# Patient Record
Sex: Male | Born: 1945 | Race: White | Hispanic: No | Marital: Married | Smoking: Current every day smoker
Health system: Southern US, Community
[De-identification: ages and names within clinical notes are randomized; demographics above are authoritative.]

## PROBLEM LIST (undated history)

## (undated) DIAGNOSIS — N529 Male erectile dysfunction, unspecified: Secondary | ICD-10-CM

## (undated) DIAGNOSIS — G4730 Sleep apnea, unspecified: Secondary | ICD-10-CM

## (undated) DIAGNOSIS — N400 Enlarged prostate without lower urinary tract symptoms: Secondary | ICD-10-CM

## (undated) DIAGNOSIS — E785 Hyperlipidemia, unspecified: Secondary | ICD-10-CM

## (undated) DIAGNOSIS — H269 Unspecified cataract: Secondary | ICD-10-CM

## (undated) DIAGNOSIS — R972 Elevated prostate specific antigen [PSA]: Secondary | ICD-10-CM

## (undated) DIAGNOSIS — J449 Chronic obstructive pulmonary disease, unspecified: Secondary | ICD-10-CM

## (undated) DIAGNOSIS — I2510 Atherosclerotic heart disease of native coronary artery without angina pectoris: Secondary | ICD-10-CM

## (undated) HISTORY — PX: APPENDECTOMY: SHX54

## (undated) HISTORY — DX: Male erectile dysfunction, unspecified: N52.9

## (undated) HISTORY — PX: EYE SURGERY: SHX253

## (undated) HISTORY — DX: Unspecified cataract: H26.9

## (undated) HISTORY — DX: Benign prostatic hyperplasia without lower urinary tract symptoms: N40.0

## (undated) HISTORY — DX: Atherosclerotic heart disease of native coronary artery without angina pectoris: I25.10

## (undated) HISTORY — DX: Chronic obstructive pulmonary disease, unspecified: J44.9

## (undated) HISTORY — DX: Hyperlipidemia, unspecified: E78.5

## (undated) HISTORY — DX: Sleep apnea, unspecified: G47.30

## (undated) HISTORY — DX: Elevated prostate specific antigen (PSA): R97.20

---
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
## 1971-02-22 HISTORY — PX: VASECTOMY: SHX75

## 1996-01-16 ENCOUNTER — Encounter (INDEPENDENT_AMBULATORY_CARE_PROVIDER_SITE_OTHER): Payer: Self-pay | Admitting: Internal Medicine

## 1996-01-16 LAB — CONVERTED CEMR LAB: PSA: 0.9 ng/mL

## 1999-12-10 ENCOUNTER — Encounter (INDEPENDENT_AMBULATORY_CARE_PROVIDER_SITE_OTHER): Payer: Self-pay | Admitting: Internal Medicine

## 1999-12-10 LAB — CONVERTED CEMR LAB: PSA: 0.9 ng/mL

## 2002-05-02 ENCOUNTER — Encounter (INDEPENDENT_AMBULATORY_CARE_PROVIDER_SITE_OTHER): Payer: Self-pay | Admitting: Internal Medicine

## 2002-05-02 LAB — CONVERTED CEMR LAB: PSA: 0.9 ng/mL

## 2005-06-21 ENCOUNTER — Encounter (INDEPENDENT_AMBULATORY_CARE_PROVIDER_SITE_OTHER): Payer: Self-pay | Admitting: Internal Medicine

## 2005-06-21 LAB — CONVERTED CEMR LAB: PSA: 1.23 ng/mL

## 2005-06-28 ENCOUNTER — Ambulatory Visit: Payer: Self-pay | Admitting: Family Medicine

## 2005-07-01 ENCOUNTER — Ambulatory Visit: Payer: Self-pay | Admitting: Family Medicine

## 2005-07-22 ENCOUNTER — Ambulatory Visit: Payer: Self-pay | Admitting: Family Medicine

## 2005-07-27 ENCOUNTER — Ambulatory Visit: Payer: Self-pay | Admitting: Internal Medicine

## 2005-08-05 ENCOUNTER — Ambulatory Visit: Payer: Self-pay | Admitting: Family Medicine

## 2005-08-17 ENCOUNTER — Ambulatory Visit (HOSPITAL_BASED_OUTPATIENT_CLINIC_OR_DEPARTMENT_OTHER): Admission: RE | Admit: 2005-08-17 | Discharge: 2005-08-17 | Payer: Self-pay | Admitting: Family Medicine

## 2005-08-17 ENCOUNTER — Encounter (INDEPENDENT_AMBULATORY_CARE_PROVIDER_SITE_OTHER): Payer: Self-pay | Admitting: Internal Medicine

## 2005-09-02 ENCOUNTER — Ambulatory Visit: Payer: Self-pay | Admitting: Family Medicine

## 2005-09-16 ENCOUNTER — Ambulatory Visit: Payer: Self-pay | Admitting: Pulmonary Disease

## 2005-10-07 ENCOUNTER — Ambulatory Visit: Payer: Self-pay | Admitting: Family Medicine

## 2005-11-25 ENCOUNTER — Ambulatory Visit: Payer: Self-pay | Admitting: Family Medicine

## 2005-12-27 ENCOUNTER — Ambulatory Visit: Payer: Self-pay | Admitting: Family Medicine

## 2006-04-28 ENCOUNTER — Encounter: Payer: Self-pay | Admitting: Internal Medicine

## 2006-04-28 DIAGNOSIS — F528 Other sexual dysfunction not due to a substance or known physiological condition: Secondary | ICD-10-CM

## 2006-04-28 DIAGNOSIS — E785 Hyperlipidemia, unspecified: Secondary | ICD-10-CM

## 2006-04-28 DIAGNOSIS — F17200 Nicotine dependence, unspecified, uncomplicated: Secondary | ICD-10-CM

## 2006-04-28 DIAGNOSIS — G4733 Obstructive sleep apnea (adult) (pediatric): Secondary | ICD-10-CM

## 2006-06-09 ENCOUNTER — Ambulatory Visit: Payer: Self-pay | Admitting: Family Medicine

## 2007-05-11 ENCOUNTER — Ambulatory Visit: Payer: Self-pay | Admitting: Family Medicine

## 2007-05-11 LAB — CONVERTED CEMR LAB
ALT: 23 units/L (ref 0–53)
AST: 23 units/L (ref 0–37)
Albumin: 3.9 g/dL (ref 3.5–5.2)
Alkaline Phosphatase: 84 units/L (ref 39–117)
BUN: 18 mg/dL (ref 6–23)
Bilirubin, Direct: 0.1 mg/dL (ref 0.0–0.3)
CO2: 30 meq/L (ref 19–32)
Calcium: 9.4 mg/dL (ref 8.4–10.5)
Chloride: 103 meq/L (ref 96–112)
Cholesterol: 197 mg/dL (ref 0–200)
Creatinine, Ser: 1.1 mg/dL (ref 0.4–1.5)
GFR calc Af Amer: 87 mL/min
GFR calc non Af Amer: 72 mL/min
Glucose, Bld: 95 mg/dL (ref 70–99)
HDL: 34.1 mg/dL — ABNORMAL LOW (ref >39.0)
LDL Cholesterol: 131 mg/dL — ABNORMAL HIGH (ref 0–99)
PSA: 2.14 ng/mL (ref 0.10–4.00)
Potassium: 4.3 meq/L (ref 3.5–5.1)
Sodium: 137 meq/L (ref 135–145)
TSH: 3.85 microintl units/mL (ref 0.35–5.50)
Total Bilirubin: 0.9 mg/dL (ref 0.3–1.2)
Total CHOL/HDL Ratio: 5.8
Total Protein: 6.7 g/dL (ref 6.0–8.3)
Triglycerides: 162 mg/dL — ABNORMAL HIGH (ref 0–149)
VLDL: 32 mg/dL (ref 0–40)

## 2007-05-21 ENCOUNTER — Ambulatory Visit: Payer: Self-pay | Admitting: Family Medicine

## 2007-07-02 ENCOUNTER — Ambulatory Visit: Payer: Self-pay | Admitting: Family Medicine

## 2007-07-02 LAB — CONVERTED CEMR LAB
OCCULT 1: NEGATIVE
OCCULT 2: NEGATIVE
OCCULT 3: NEGATIVE

## 2007-07-04 ENCOUNTER — Encounter (INDEPENDENT_AMBULATORY_CARE_PROVIDER_SITE_OTHER): Payer: Self-pay | Admitting: *Deleted

## 2008-04-28 ENCOUNTER — Ambulatory Visit: Payer: Self-pay | Admitting: Family Medicine

## 2008-04-28 LAB — CONVERTED CEMR LAB
ALT: 17 units/L (ref 0–53)
AST: 20 units/L (ref 0–37)
Albumin: 3.8 g/dL (ref 3.5–5.2)
Alkaline Phosphatase: 81 units/L (ref 39–117)
BUN: 20 mg/dL (ref 6–23)
Basophils Absolute: 0 10*3/uL (ref 0.0–0.1)
Basophils Relative: 0.1 % (ref 0.0–3.0)
Bilirubin, Direct: 0.1 mg/dL (ref 0.0–0.3)
CO2: 30 meq/L (ref 19–32)
Calcium: 9.4 mg/dL (ref 8.4–10.5)
Chloride: 105 meq/L (ref 96–112)
Cholesterol: 192 mg/dL (ref 0–200)
Creatinine, Ser: 1.1 mg/dL (ref 0.4–1.5)
Eosinophils Absolute: 0.3 10*3/uL (ref 0.0–0.7)
Eosinophils Relative: 3.2 % (ref 0.0–5.0)
GFR calc Af Amer: 86 mL/min
GFR calc non Af Amer: 71 mL/min
Glucose, Bld: 106 mg/dL — ABNORMAL HIGH (ref 70–99)
HCT: 45 % (ref 39.0–52.0)
HDL: 37.1 mg/dL — ABNORMAL LOW (ref >39.0)
Hemoglobin: 16.1 g/dL (ref 13.0–17.0)
LDL Cholesterol: 141 mg/dL — ABNORMAL HIGH (ref 0–99)
Lymphocytes Relative: 17.5 % (ref 12.0–46.0)
MCHC: 35.7 g/dL (ref 30.0–36.0)
MCV: 92.5 fL (ref 78.0–100.0)
Monocytes Absolute: 0.9 10*3/uL (ref 0.1–1.0)
Monocytes Relative: 9.4 % (ref 3.0–12.0)
Neutro Abs: 7 10*3/uL (ref 1.4–7.7)
Neutrophils Relative %: 69.8 % (ref 43.0–77.0)
PSA: 2.36 ng/mL (ref 0.10–4.00)
Platelets: 183 10*3/uL (ref 150–400)
Potassium: 5 meq/L (ref 3.5–5.1)
RBC: 4.86 M/uL (ref 4.22–5.81)
RDW: 12.2 % (ref 11.5–14.6)
Sodium: 140 meq/L (ref 135–145)
TSH: 2.45 microintl units/mL (ref 0.35–5.50)
Total Bilirubin: 1 mg/dL (ref 0.3–1.2)
Total CHOL/HDL Ratio: 5.2
Total Protein: 6.7 g/dL (ref 6.0–8.3)
Triglycerides: 72 mg/dL (ref 0–149)
VLDL: 14 mg/dL (ref 0–40)
WBC: 9.9 10*3/uL (ref 4.5–10.5)

## 2008-05-08 ENCOUNTER — Ambulatory Visit: Payer: Self-pay | Admitting: Family Medicine

## 2008-05-12 ENCOUNTER — Encounter: Payer: Self-pay | Admitting: Family Medicine

## 2009-09-24 ENCOUNTER — Encounter (INDEPENDENT_AMBULATORY_CARE_PROVIDER_SITE_OTHER): Payer: Self-pay | Admitting: *Deleted

## 2009-12-04 ENCOUNTER — Telehealth (INDEPENDENT_AMBULATORY_CARE_PROVIDER_SITE_OTHER): Payer: Self-pay | Admitting: *Deleted

## 2009-12-07 ENCOUNTER — Ambulatory Visit: Payer: Self-pay | Admitting: Family Medicine

## 2009-12-07 LAB — CONVERTED CEMR LAB
ALT: 19 units/L (ref 0–53)
AST: 20 units/L (ref 0–37)
Albumin: 4.2 g/dL (ref 3.5–5.2)
Alkaline Phosphatase: 78 units/L (ref 39–117)
BUN: 14 mg/dL (ref 6–23)
Bilirubin, Direct: 0.1 mg/dL (ref 0.0–0.3)
CO2: 29 meq/L (ref 19–32)
Calcium: 9.7 mg/dL (ref 8.4–10.5)
Chloride: 102 meq/L (ref 96–112)
Cholesterol: 212 mg/dL — ABNORMAL HIGH (ref 0–200)
Creatinine, Ser: 1 mg/dL (ref 0.4–1.5)
Direct LDL: 137.1 mg/dL
GFR calc non Af Amer: 80.19 mL/min (ref >60)
Glucose, Bld: 93 mg/dL (ref 70–99)
HDL: 40.9 mg/dL (ref >39.00)
PSA: 4.77 ng/mL — ABNORMAL HIGH (ref 0.10–4.00)
Potassium: 4.8 meq/L (ref 3.5–5.1)
Sodium: 138 meq/L (ref 135–145)
TSH: 2.13 microintl units/mL (ref 0.35–5.50)
Total Bilirubin: 1.1 mg/dL (ref 0.3–1.2)
Total CHOL/HDL Ratio: 5
Total Protein: 7.1 g/dL (ref 6.0–8.3)
Triglycerides: 165 mg/dL — ABNORMAL HIGH (ref 0.0–149.0)
VLDL: 33 mg/dL (ref 0.0–40.0)

## 2009-12-10 ENCOUNTER — Ambulatory Visit: Payer: Self-pay | Admitting: Family Medicine

## 2009-12-10 ENCOUNTER — Encounter: Payer: Self-pay | Admitting: Gastroenterology

## 2009-12-10 DIAGNOSIS — R972 Elevated prostate specific antigen [PSA]: Secondary | ICD-10-CM

## 2010-01-21 HISTORY — PX: COLONOSCOPY: SHX174

## 2010-01-22 ENCOUNTER — Encounter (INDEPENDENT_AMBULATORY_CARE_PROVIDER_SITE_OTHER): Payer: Self-pay | Admitting: *Deleted

## 2010-01-25 ENCOUNTER — Ambulatory Visit: Payer: Self-pay | Admitting: Gastroenterology

## 2010-02-10 ENCOUNTER — Ambulatory Visit: Payer: Self-pay | Admitting: Gastroenterology

## 2010-02-10 LAB — HM COLONOSCOPY

## 2010-02-12 ENCOUNTER — Encounter: Payer: Self-pay | Admitting: Gastroenterology

## 2010-03-03 ENCOUNTER — Ambulatory Visit
Admission: RE | Admit: 2010-03-03 | Discharge: 2010-03-03 | Payer: Self-pay | Source: Home / Self Care | Attending: Family Medicine | Admitting: Family Medicine

## 2010-03-03 ENCOUNTER — Other Ambulatory Visit: Payer: Self-pay | Admitting: Family Medicine

## 2010-03-04 LAB — PSA: PSA: 5.07 ng/mL — ABNORMAL HIGH (ref 0.10–4.00)

## 2010-03-10 ENCOUNTER — Ambulatory Visit
Admission: RE | Admit: 2010-03-10 | Discharge: 2010-03-10 | Payer: Self-pay | Source: Home / Self Care | Attending: Family Medicine | Admitting: Family Medicine

## 2010-03-19 ENCOUNTER — Encounter: Payer: Self-pay | Admitting: Family Medicine

## 2010-03-23 NOTE — Letter (Signed)
Summary: Moviprep Instructions  [facility]  [address]   [place], [place] [address]   Phone: [phone]  Fax: [phone]       Richard Powell    06-09-1945    MRN: [id number]        Procedure Day Dorna Bloom: Wednesday, 02-10-10     Arrival Time: 9:00 a.m.     Procedure Time: 10:00 a.m.     Location of Procedure:                     x  [facility] (4th Floor)   PREPARATION FOR COLONOSCOPY WITH MOVIPREP   Starting 5 days prior to your procedure 02-05-10 do not eat nuts, seeds, popcorn, corn, beans, peas,  salads, or any raw vegetables.  Do not take any fiber supplements (e.g. Metamucil, Citrucel, and Benefiber).  THE DAY BEFORE YOUR PROCEDURE         DATE: 02-09-10   DAY: Tuesday  1.  Drink clear liquids the entire day-NO SOLID FOOD  2.  Do not drink anything colored red or purple.  Avoid juices with pulp.  No orange juice.  3.  Drink at least 64 oz. (8 glasses) of fluid/clear liquids during the day to prevent dehydration and help the prep work efficiently.  CLEAR LIQUIDS INCLUDE: Water Jello Ice Popsicles Tea (sugar ok, no milk/cream) Powdered fruit flavored drinks Coffee (sugar ok, no milk/cream) Gatorade Juice: apple, white grape, white cranberry  Lemonade Clear bullion, consomm, broth Carbonated beverages (any kind) Strained chicken noodle soup Hard Candy                             4.  In the morning, mix first dose of MoviPrep solution:    Empty 1 Pouch A and 1 Pouch B into the disposable container    Add lukewarm drinking water to the top line of the container. Mix to dissolve    Refrigerate (mixed solution should be used within 24 hrs)  5.  Begin drinking the prep at 5:00 p.m. The MoviPrep container is divided by 4 marks.   Every 15 minutes drink the solution down to the next mark (approximately 8 oz) until the full liter is complete.   6.  Follow completed prep with 16 oz of clear liquid of your choice (Nothing red or  purple).  Continue to drink clear liquids until bedtime.  7.  Before going to bed, mix second dose of MoviPrep solution:    Empty 1 Pouch A and 1 Pouch B into the disposable container    Add lukewarm drinking water to the top line of the container. Mix to dissolve    Refrigerate  THE DAY OF YOUR PROCEDURE      DATE: 02-10-10   DAY: Wednesday  Beginning at 5:00 a.m. (5 hours before procedure):         1. Every 15 minutes, drink the solution down to the next mark (approx 8 oz) until the full liter is complete.  2. Follow completed prep with 16 oz. of clear liquid of your choice.    3. You may drink clear liquids until  8:00 a.m.  (2 HOURS BEFORE PROCEDURE).   MEDICATION INSTRUCTIONS  Unless otherwise instructed, you should take regular prescription medications with a small sip of water   as early as possible the morning of your procedure.           OTHER INSTRUCTIONS  You  will need a responsible adult at least 67 years of age to accompany you and drive you home.   This person must remain in the waiting room during your procedure.  Wear loose fitting clothing that is easily removed.  Leave jewelry and other valuables at home.  However, you may wish to bring a book to read or  an iPod/MP3 player to listen to music as you wait for your procedure to start.  Remove all body piercing jewelry and leave at home.  Total time from sign-in until discharge is approximately 2-3 hours.  You should go home directly after your procedure and rest.  You can resume normal activities the  day after your procedure.  The day of your procedure you should not:   Drive   Make legal decisions   Operate machinery   Drink alcohol   Return to work  You will receive specific instructions about eating, activities and medications before you leave.    The above instructions have been reviewed and explained to me by   Sherren Kerns RN  January 25, 2010 4:54 PM     I fully understand  and can verbalize these instructions _____________________________ Date _________

## 2010-03-23 NOTE — Progress Notes (Signed)
----   Converted from flag ---- ---- 12/02/2009 1:16 PM, Shaune Leeks MD wrote: bmet v70.0 hepatic chol prof tsh 272.4 psa v76.44  ---- 12/02/2009 12:01 PM, Liane Comber CMA (AAMA) wrote: Lab orders please! Good Morning! This pt is scheduled for cpx labs Monday, which labs to draw and dx codes to use? Thanks Tasha ------------------------------

## 2010-03-23 NOTE — Miscellaneous (Signed)
Summary: previsit prep/rm  Clinical Lists Changes  Medications: Added new medication of MOVIPREP 100 GM  SOLR (PEG-KCL-NACL-NASULF-NA ASC-C) As per prep instructions. - Signed Rx of MOVIPREP 100 GM  SOLR (PEG-KCL-NACL-NASULF-NA ASC-C) As per prep instructions.;  #1 x 0;  Signed;  Entered by: Sherren Kerns RN;  Authorized by: Mardella Layman MD [facility];  Method used: [facility]  [address] [place], [place]  [address], Ph: [phone] or [phone], Fax: [phone] Observations: Added new observation of ALLERGY REV: Done (01/25/2010 16:14)    Prescriptions: MOVIPREP 100 GM  SOLR (PEG-KCL-NACL-NASULF-NA ASC-C) As per prep instructions.  #1 x 0   Entered by:   Sherren Kerns RN   Authorized by:   Mardella Layman MD [facility]   Signed by:   Sherren Kerns RN on 01/25/2010   Method used:   [facility]        [facility]  [address] (retail)       [address]       [place], [place]  [address]       Ph: [phone] or [phone]       Fax: [phone]   RxID:   [id number]

## 2010-03-23 NOTE — Letter (Signed)
Summary: Pre Visit Letter Revised  [facility]  [address] [place], [place] [address]   Phone: [phone]  Fax: [phone]        12/10/2009 MRN: [id number]  Richard Powell [address] [place], [place]  [address]             Procedure Date:  12-21 10am   Welcome to the Gastroenterology Division at [facility]    You are scheduled to see a nurse for your pre-procedure visit on 01-25-10 at 4:30pm on the 3rd floor at [facility], [address]  We ask that you try to arrive at our office 15 minutes prior to your appointment time to allow for check-in.  Please take a minute to review the attached form.  If you answer "Yes" to one or more of the questions on the first page, we ask that you call the person listed at your earliest opportunity.  If you answer "No" to all of the questions, please complete the rest of the form and bring it to your appointment.    Your nurse visit will consist of discussing your medical and surgical history, your immediate family medical history, and your medications.   If you are unable to list all of your medications on the form, please bring the medication bottles to your appointment and we will list them.  We will need to be aware of both prescribed and over the counter drugs.  We will need to know exact dosage information as well.    Please be prepared to read and sign documents such as consent forms, a financial agreement, and acknowledgement forms.  If necessary, and with your consent, a friend or relative is welcome to sit-in on the nurse visit with you.  Please bring your insurance card so that we may make a copy of it.  If your insurance requires a referral to see a specialist, please bring your referral form from your primary care physician.  No co-pay is required for this nurse visit.     If you cannot keep your appointment, please call [phone] to cancel or reschedule prior to your appointment date.  This  allows [place] the opportunity to schedule an appointment for another patient in need of care.    Thank you for choosing [facility] for your medical needs.  We appreciate the opportunity to care for you.  Please visit [place] at our website  to learn more about our practice.  Sincerely, The Gastroenterology Division

## 2010-03-23 NOTE — Assessment & Plan Note (Signed)
Summary: CPX/CLE   Vital Signs:  Patient profile:   65 year old male Height:      70.25 inches Weight:      191.12 pounds BMI:     27.33 Temp:     98.6 degrees F oral Pulse rate:   64 / minute Pulse rhythm:   regular BP sitting:   140 / 80  (left arm) Cuff size:   large  Vitals Entered By: Sydell Axon LPN (December 10, 2009 1:46 PM) CC: 30 Minute checkup, had a colonoscopy ?1992  Vision Screening:Left eye w/o correction: 20 / 20 Right Eye w/o correction: 20 / 25 Both eyes w/o correction:  20/ 20       25db HL: Left  500 hz: 25db 1000 hz: 25db 2000 hz: 25db 4000 hz: 25db Right  500 hz: 25db 1000 hz: 25db 2000 hz: 25db 4000 hz: 25db    History of Present Illness: Pt here for Comp Exam. He is feeling well and has no complaints except low back pain which he thinks is from his bed.  Mom recently found to have colon cancer with prob mets to liver. Brother then told him had polyps.  Preventive Screening-Counseling & Management  Alcohol-Tobacco     Alcohol drinks/day: <1     Alcohol type: beer occas     Smoking Status: current     Smoking Cessation Counseling: yes     Packs/Day: 1/2ppd     Year Started: 1969     Passive Smoke Exposure: no  Caffeine-Diet-Exercise     Caffeine use/day: 2     Does Patient Exercise: yes     Type of exercise: generalized work around house.     Times/week: <3  Problems Prior to Update: 1)  Special Screening Malig Neoplasms Other Sites  (ICD-V76.49) 2)  Health Maintenance Exam  (ICD-V70.0) 3)  Special Screening Malignant Neoplasm of Prostate  (ICD-V76.44) 4)  Erectile Dysfunction  (ICD-302.72) 5)  Sleep Apnea  (ICD-780.57) 6)  Nicotine Addiction  (ICD-305.1) 7)  Hyperlipidemia  (ICD-272.4)  Medications Prior to Update: 1)  Melatonin 3 Mg  Tabs (Melatonin) .Marland Kitchen.. 1 At Bedtime By Mouth 2)  Acid Reducer 10 Mg Tabs (Famotidine) .... Over The Counter As Needed As Needed  Allergies: No Known Drug Allergies  Past History:  Past  Medical History: Last updated: 04/28/2006 Anxiety Depression Hyperlipidemia  Past Surgical History: Last updated: 05/21/2007 L Hand Surgery in [facility]  Sigmoidoscopy nml 1992  Family History: Last updated: 12/10/2009 Father: unknown Mother: A 88  Knee Discomfort. Colon Ca  Brother 1/2  A 58   Polyps MGM died 34's ? abd ca No family hx DM, CVA, CAD  Social History: Last updated: 04/28/2006 Marital Status: Married, lives with wife Children: 3--out of the home Occupation: teaching at [employer]  Risk Factors: Alcohol Use: <1 (12/10/2009) Caffeine Use: 2 (12/10/2009) Exercise: yes (12/10/2009)  Risk Factors: Smoking Status: current (12/10/2009) Packs/Day: 1/2ppd (12/10/2009) Passive Smoke Exposure: no (12/10/2009)  Family History: Father: unknown Mother: A 88  Knee Discomfort. Colon Ca  Brother 1/2  A 58   Polyps MGM died 1's ? abd ca No family hx DM, CVA, CAD  Review of Systems General:  Denies chills, fatigue, fever, sweats, weakness, and weight loss. Eyes:  Denies blurring, discharge, and eye pain. ENT:  Denies decreased hearing, ear discharge, earache, and ringing in ears. CV:  Denies chest pain or discomfort, fainting, fatigue, palpitations, shortness of breath with exertion, swelling of feet, and swelling of hands. Resp:  Denies cough, shortness of breath, and wheezing. GI:  Denies abdominal pain, bloody stools, change in bowel habits, constipation, dark tarry stools, diarrhea, indigestion, loss of appetite, nausea, vomiting, vomiting blood, and yellowish skin color. GU:  Complains of nocturia; denies discharge and urinary frequency; stable 2-3. MS:  Complains of low back pain; denies joint pain, muscle aches, cramps, and stiffness; see HPI  May cramp with prolonged work. Derm:  Denies dryness, itching, and rash. Neuro:  Denies numbness, poor balance, tingling, and tremors.  Physical Exam  General:  Well-developed,well-nourished,in no acute distress;  alert,appropriate and cooperative throughout examination Head:  Normocephalic and atraumatic without obvious abnormalities. No apparent alopecia or balding. Eyes:  Conjunctiva clear bilaterally.  Ears:  External ear exam shows no significant lesions or deformities.  Otoscopic examination reveals clear canals, tympanic membranes are intact bilaterally without bulging, retraction, inflammation or discharge. Hearing is grossly normal bilaterally. Nose:  External nasal examination shows no deformity or inflammation. Nasal mucosa are pink and moist without lesions or exudates. Mouth:  Oral mucosa and oropharynx without lesions or exudates.  Teeth in good repair. Neck:  No deformities, masses, or tenderness noted. Chest Wall:  No deformities, masses, tenderness or gynecomastia noted. Breasts:  No masses or gynecomastia noted Lungs:  Normal respiratory effort, chest expands symmetrically. Lungs are clear to auscultation, no crackles or wheezes. Heart:  Normal rate and regular rhythm. S1 and S2 normal without gallop, murmur, click, rub or other extra sounds. Abdomen:  Bowel sounds positive,abdomen soft and non-tender without masses, organomegaly or hernias noted. Rectal:  No external abnormalities noted. Normal sphincter tone. No rectal masses or tenderness. G neg. Genitalia:  Testes bilaterally descended without nodularity, tenderness or masses. No scrotal masses or lesions. No penis lesions or urethral discharge. Prostate:  Prostate gland firm and smooth, no enlargement, nodularity, tenderness, mass, asymmetry or induration. 20-30gms. Msk:  No deformity or scoliosis noted of thoracic or lumbar spine.   Pulses:  R and L carotid,radial,femoral,dorsalis pedis and posterior tibial pulses are full and equal bilaterally Extremities:  No clubbing, cyanosis, edema, or deformity noted with normal full range of motion of all joints.   Tenderness over lat epicondyle resolved. Neurologic:  No cranial nerve deficits  noted. Station and gait are normal. Sensory, motor and coordinative functions appear intact. Skin:  Intact without suspicious lesions or rashes, multip benign molea and AKs. Cervical Nodes:  No lymphadenopathy noted Inguinal Nodes:  No significant adenopathy Psych:  Cognition and judgment appear intact. Alert and cooperative with normal attention span and concentration. No apparent delusions, illusions, hallucinations   Impression & Recommendations:  Problem # 1:  HEALTH MAINTENANCE EXAM (ICD-V70.0)  Reviewed preventive care protocols, scheduled due services, and updated immunizations. Refer for colonoscopy. I have personally reviewed the Medicare Annual Wellness questionnaire and have noted 1.   The patient's medical and social history 2.   His  use of alcohol, tobacco or illicit drugs 3.   His  current medications and supplements 4.   The patient's functional ability including ADL's, fall risks, home safety risks and hearing or visual             impairment. 5.   Diet and physical activities 6.   Evidence for depression or mood disorders   Orders: Medicare -1st Annual Wellness Visit [phone])  Problem # 2:  NEOPLASM, MALIGNANT, COLON, FAMILY HX, MOTHER (ICD-V16.0) Assessment: New Refer for colonoscopy. Orders: Gastroenterology Referral (GI)  Problem # 3:  ELEVATED PROSTATE SPECIFIC ANTIGEN (ICD-790.93) Assessment: New RTC 3  mos for recheck. Discussed screening, prob trmt for subtle infection next time if higher. If signif higher, send for eval.  Problem # 4:  SLEEP APNEA (ICD-780.57) Assessment: Unchanged  Wears CPAP regularly which he thinks contributes to his LBP in AM.  Problem # 5:  NICOTINE ADDICTION (ICD-305.1) Assessment: Improved Smoking less, encouraged to quit totally.  Problem # 6:  HYPERLIPIDEMIA (ICD-272.4) Assessment: Unchanged LDL mildly elevated....avoid fatty food. Labs Reviewed: SGOT: 20 (12/07/2009)   SGPT: 19 (12/07/2009)   HDL:40.90 (12/07/2009),  37.1 (04/28/2008)  LDL:141 (04/28/2008), 131 (05/11/2007)  Chol:212 (12/07/2009), 192 (04/28/2008)  Trig:165.0 (12/07/2009), 72 (04/28/2008)  Complete Medication List: 1)  Melatonin 3 Mg Tabs (Melatonin) .Marland Kitchen.. 1 at bedtime by mouth 2)  Acid Reducer 10 Mg Tabs (Famotidine) .... Over the counter as needed as needed  Other Orders: Audiometry [phone]) Vision Screening ([address])  Patient Instructions: 1)  Refer to GI for colonoscopy (Mom with recent colon ca) 2)  RTC 3 mos with PSA prior   Orders Added: 1)  Audiometry [92552] 2)  Vision Screening [[address]] 3)  Gastroenterology Referral [GI] 4)  Medicare -1st Annual Wellness Visit [G0438]    Current Allergies (reviewed today): No known allergies

## 2010-03-23 NOTE — Letter (Signed)
Summary: Nadara Eaton letter  [facility]  [address] [place], [place] [address]   Phone: [phone]  Fax: [phone]       09/24/2009 MRN: [id number]  Elic Popovich [address] [place], [place]  [address]  Dear Mr. [facility] [place], and [place] announce the retirement of Arta Silence, M.D., from full-time practice at the [facility] office effective August 20, 2009 and his plans of returning part-time.  It is important to Dr. Hetty Ely and to our practice that you understand that [facility] has seven physicians in our office for your health care needs.  We will continue to offer the same exceptional care that you have today.    Dr. Hetty Ely has spoken to many of you about his plans for retirement and returning part-time in the fall.   We will continue to work with you through the transition to schedule appointments for you in the office and meet the high standards that [place] is committed to.   Again, it is with great pleasure that we share the news that Dr. Hetty Ely will return to [facility] in October of 2011 with a reduced schedule.    If you have any questions, or would like to request an appointment with one of our physicians, please call us at [phone] and press the option for Scheduling an appointment.  We take pleasure in providing you with excellent patient care and look forward to seeing you at your next office visit.  Our [facility] Physicians are:  Tillman Abide, M.D. Laurita Quint, M.D. Roxy Manns, M.D. Kerby Nora, M.D. Hannah Beat, M.D. Ruthe Mannan, M.D. We proudly welcomed Raechel Ache, M.D. and Eustaquio Boyden, M.D. to the practice in July/August 2011.  Sincerely,  [facility]

## 2010-03-25 NOTE — Procedures (Signed)
Summary: Colonoscopy  Patient: Richard Powell Note: All result statuses are Final unless otherwise noted.  Tests: (1) Colonoscopy (COL)   COL Colonoscopy           DONE     [facility]     [address]     [place], [place]  [address]           COLONOSCOPY PROCEDURE REPORT           PATIENT:  Richard Powell, Richard Powell  MR#:  [id number]     BIRTHDATE:  [birth year], 64 yrs. old  GENDER:  male     ENDOSCOPIST:  Zailen Albarran. Jarold Motto, MD, [facility]     REF. BY:  Laurita Quint, M.D.     PROCEDURE DATE:  02/10/2010     PROCEDURE:  Colonoscopy with biopsy     ASA CLASS:  Class II     INDICATIONS:  family history of colon cancer     MEDICATIONS:   Fentanyl 50 mcg IV, Versed 5 mg IV           DESCRIPTION OF PROCEDURE:   After the risks benefits and     alternatives of the procedure were thoroughly explained, informed     consent was obtained.  Digital rectal exam was performed and     revealed no abnormalities.   The LB [id number] endoscope     was introduced through the anus and advanced to the cecum, which     was identified by both the appendix and ileocecal valve, without     limitations.  The quality of the prep was excellent, using     MoviPrep.  The instrument was then slowly withdrawn as the colon     was fully examined.     <<PROCEDUREIMAGES>>           FINDINGS:  Severe diverticulosis was found in the sigmoid to     descending colon segments.  A diminutive polyp was found in the     rectum. COLD BX. REMOVAL OF POLYP IN RECTUM.  This was     otherwise a normal examination of the colon.   Retroflexed views     in the rectum revealed no abnormalities.    The scope was then     withdrawn from the patient and the procedure completed.           COMPLICATIONS:  None     ENDOSCOPIC IMPRESSION:     1) Severe diverticulosis in the sigmoid to descending colon     segments     2) Diminutive polyp in the rectum     3) Otherwise normal examination     R/O ADENOMA  RECOMMENDATIONS:     1) high fiber diet     2) Given your significant family history of colon cancer, you     should have a repeat colonoscopy in 5 years     REPEAT EXAM:  No           ______________________________     Vania Rea. Jarold Motto, MD, [facility]           CC:           n.     eSIGNED:   Vania Rea. Patterson at 02/10/2010 10:58 AM           Andree Moro, [id number]  Note: An exclamation mark (!) indicates a result that was not dispersed into the flowsheet. Document Creation Date: 02/10/2010 10:58 AM _______________________________________________________________________  (1)  Order result status: Final Collection or observation date-time: 02/10/2010 10:48 Requested date-time:  Receipt date-time:  Reported date-time:  Referring Physician:   Ordering Physician: Sheryn Bison [phone]) Specimen Source:  Source: Launa Grill Order Number: [id number] Lab site:   Appended Document: Colonoscopy 5Y F/U  Appended Document: Colonoscopy 5y f/u  Appended Document: Colonoscopy     Procedures Next Due Date:    Colonoscopy: 01/2015  Appended Document: Colonoscopy    Clinical Lists Changes  Observations: Added new observation of PAST SURG HX: L Hand Surgery in [facility]  Sigmoidoscopy nml 1992 Colonoscopy Severe Divertics  Polyp benign (Dr  Jarold Motto) 02/10/2010 (02/12/2010 21:28)       Past Surgical History:    L Hand Surgery in [facility]     Sigmoidoscopy nml 1992    Colonoscopy Severe Divertics  Polyp benign (Dr  Jarold Motto) 02/10/2010

## 2010-03-25 NOTE — Letter (Signed)
Summary: Patient Notice- Polyp Results  [facility]  [address] [place], [place] [address]   Phone: [phone]  Fax: [phone]        February 12, 2010 MRN: [id number]    Richard Powell [address] [place], [place]  [address]    Dear [name],  I am pleased to inform you that the colon polyp(s) removed during your recent colonoscopy was (were) found to be benign (no cancer detected) upon pathologic examination.  I recommend you have a repeat colonoscopy examination in 5_ years to look for recurrent polyps, as having colon polyps increases your risk for having recurrent polyps or even colon cancer in the future.  Should you develop new or worsening symptoms of abdominal pain, bowel habit changes or bleeding from the rectum or bowels, please schedule an evaluation with either your primary care physician or with me.  Additional information/recommendations:  XX__ No further action with gastroenterology is needed at this time. Please      follow-up with your primary care physician for your other healthcare      needs.  __ Please call [phone] to schedule a return visit to review your      situation.  __ Please keep your follow-up visit as already scheduled.  __ Continue treatment plan as outlined the day of your exam.  Please call us if you are having persistent problems or have questions about your condition that have not been fully answered at this time.  Sincerely,  Mardella Layman MD [facility]  This letter has been electronically signed by your physician.

## 2010-03-25 NOTE — Assessment & Plan Note (Signed)
Summary: ROA FOR 3 MONTH FOLLOW-UP/JRR   Vital Signs:  Patient profile:   65 year old male Weight:      192.50 pounds Temp:     98.9 degrees F oral Pulse rate:   68 / minute Pulse rhythm:   regular BP sitting:   124 / 72  (left arm) Cuff size:   large  Vitals Entered By: Sydell Axon LPN (March 10, 2010 2:32 PM) CC: 3 Month follow-up on prostate   History of Present Illness: Pt here for followup of elevated PSA at PE last time. We chose to watch and repeat and it has continued to rise on a fairly steep trajectory. He feels well and has a close friend who recently underwent prostatectomy. He has done no unusual activity lately which would falsely elevate his PSA.  Problems Prior to Update: 1)  Elevated Prostate Specific Antigen  (ICD-790.93) 2)  Neoplasm, Malignant, Colon, Family Hx, Mother  (ICD-V16.0) 3)  Special Screening Malig Neoplasms Other Sites  (ICD-V76.49) 4)  Health Maintenance Exam  (ICD-V70.0) 5)  Special Screening Malignant Neoplasm of Prostate  (ICD-V76.44) 6)  Erectile Dysfunction  (ICD-302.72) 7)  Sleep Apnea  (ICD-780.57) 8)  Nicotine Addiction  (ICD-305.1) 9)  Hyperlipidemia  (ICD-272.4)  Medications Prior to Update: 1)  Melatonin 3 Mg  Tabs (Melatonin) .Marland Kitchen.. 1 At Bedtime By Mouth 2)  Acid Reducer 10 Mg Tabs (Famotidine) .... Over The Counter As Needed As Needed  Current Medications (verified): 1)  Melatonin 3 Mg  Tabs (Melatonin) .Marland Kitchen.. 1 At Bedtime By Mouth 2)  Acid Reducer 10 Mg Tabs (Famotidine) .... Over The Counter As Needed As Needed  Allergies: No Known Drug Allergies  Physical Exam  General:  Well-developed,well-nourished,in no acute distress; alert,appropriate and cooperative throughout examination   Impression & Recommendations:  Problem # 1:  ELEVATED PROSTATE SPECIFIC ANTIGEN (ICD-790.93) Assessment Deteriorated Prostate Antigen continues on elevated trajectory and therefore suggests referral. Orders: Urology Referral  (Urology)  Complete Medication List: 1)  Melatonin 3 Mg Tabs (Melatonin) .Marland Kitchen.. 1 at bedtime by mouth 2)  Acid Reducer 10 Mg Tabs (Famotidine) .... Over the counter as needed as needed  Patient Instructions: 1)  Refer to Urology for elevated PSA.  2)  Questions of today answered.   Orders Added: 1)  Urology Referral [Urology] 2)  Est. Patient Level III [04540]    Current Allergies (reviewed today): No known allergies

## 2010-03-25 NOTE — Letter (Signed)
Summary: Patient Notice- Polyp Results  [facility]  [address] [place], [place] [address]   Phone: [phone]  Fax: [phone]        February 12, 2010 MRN: [id number]    Richard Powell [address] [place], [place]  [address]    Dear Richard Powell,  I am pleased to inform you that the colon polyp(s) removed during your recent colonoscopy was (were) found to be benign (no cancer detected) upon pathologic examination.  I recommend you have a repeat colonoscopy examination in 5_ years to look for recurrent polyps, as having colon polyps increases your risk for having recurrent polyps or even colon cancer in the future.PER FAMILY HISTORY....  Should you develop new or worsening symptoms of abdominal pain, bowel habit changes or bleeding from the rectum or bowels, please schedule an evaluation with either your primary care physician or with me.  Additional information/recommendations:  _X_ No further action with gastroenterology is needed at this time. Please      follow-up with your primary care physician for your other healthcare      needs.  __ Please call [phone] to schedule a return visit to review your      situation.  __ Please keep your follow-up visit as already scheduled.  __ Continue treatment plan as outlined the day of your exam.  Please call us if you are having persistent problems or have questions about your condition that have not been fully answered at this time.  Sincerely,  Mardella Layman MD [facility]  This letter has been electronically signed by your physician.  Appended Document: Patient Notice- Polyp Results Letter mailed

## 2010-04-08 NOTE — Letter (Signed)
Summary: [facility]  [facility]   Imported By: Kassie Mends 03/30/2010 08:59:36  _____________________________________________________________________  External Attachment:    Type:   Image     Comment:   External Document

## 2010-07-09 NOTE — Procedures (Signed)
[name], Richard Powell NO.:  [id number]   MEDICAL RECORD NO.:  [id number]          PATIENT TYPE:  OUT   LOCATION:  SLEEP CENTER                 FACILITY:  [facility]   PHYSICIAN:  Marcelyn Bruins, M.D. [facility] DATE OF BIRTH:  [birth year]   DATE OF STUDY:  08/17/2005                              NOCTURNAL POLYSOMNOGRAM    REFERRING PHYSICIAN:  Dr. Laurita Quint   INDICATION FOR THE STUDY:  Hypersomnia with sleep apnea.   EPWORTH SCORE:  4.   SLEEP ARCHITECTURE:  The patient had a total sleep time of 352 minutes with  decreased REM, and never achieved slow wave sleep.  Sleep onset latency was  normal and REM onset was prolonged at 156 minutes.  Sleep efficiency was  decreased at 72%.   RESPIRATORY DATA:  The patient was found by split protocol to have 264  obstructive events in the first 272 minutes of sleep.  This gave the patient  a respiratory disturbance index of 58 events per hour.  The events were not  positional, but there was snoring noted throughout.  By protocol the patient  was placed on a large Ultra Mirage full face mask and ultimately titrated to  a final pressure of 10 cm of water with excellent control of both  obstructive events and snoring.   OXYGEN DATA:  There was O2 desaturation as low as 89% with the patient's  obstructive events.   CARDIAC DATA:  No clinically significant cardiac arrhythmias.   MOVEMENT SLASH PARASOMNIA:  There were 34 leg jerks during the night with  less than 1 resulting in arousal or awakening.  This is not clinically  significant.   IMPRESSION RECOMMENDATIONS:  A split night study reveals severe obstructive  sleep apnea/hypopnea syndrome with a respiratory disturbance index of 58  events per hour and O2 desaturation as low as 89% during the first half of  the night.  The patient was then placed on a large Ultra Mirage full face  mask and titrated to a therapeutic pressure of 10 cm of water, with  excellent control of  events.          ______________________________  Marcelyn Bruins, M.D. [facility]  Diplomate, American Board of Sleep  Medicine    KC/MEDQ  D:  09/12/2005 16:57:38  T:  09/12/2005 21:42:14  Job:  [id number]

## 2010-07-31 ENCOUNTER — Encounter: Payer: Self-pay | Admitting: Family Medicine

## 2010-08-02 ENCOUNTER — Ambulatory Visit (INDEPENDENT_AMBULATORY_CARE_PROVIDER_SITE_OTHER): Payer: Medicare Other | Admitting: Family Medicine

## 2010-08-02 ENCOUNTER — Encounter: Payer: Self-pay | Admitting: Family Medicine

## 2010-08-02 DIAGNOSIS — N451 Epididymitis: Secondary | ICD-10-CM

## 2010-08-02 DIAGNOSIS — R972 Elevated prostate specific antigen [PSA]: Secondary | ICD-10-CM

## 2010-08-02 DIAGNOSIS — N453 Epididymo-orchitis: Secondary | ICD-10-CM

## 2010-08-02 MED ORDER — CIPROFLOXACIN HCL 500 MG PO TABS: 500.0000 mg | ORAL_TABLET | Freq: Two times a day (BID) | ORAL | Status: DC

## 2010-08-02 NOTE — Assessment & Plan Note (Signed)
Chronic Prostatitis?  Inflammation not infection, poss causing pain. If not responding to Cipro, will try NSAID.

## 2010-08-02 NOTE — Progress Notes (Signed)
  Subjective:    Patient ID: Richard Powell, male    DOB: June 09, 1945, 65 y.o.   MRN: [id number]  HPI Pt here for eval of acute pain for the las few weeks     Review of Systems  Constitutional: Negative for fever, chills, diaphoresis, activity change, appetite change and fatigue.  HENT: Negative for hearing loss, ear pain, congestion, sore throat, rhinorrhea, neck pain, neck stiffness, postnasal drip, sinus pressure, tinnitus and ear discharge.   Eyes: Negative for pain, discharge and visual disturbance.  Respiratory: Negative for cough, shortness of breath and wheezing.   Cardiovascular: Negative for chest pain and palpitations.       No SOB w/ exertion  Gastrointestinal:       No heartburn or swallowing problems.  Genitourinary: Positive for testicular pain. Negative for dysuria, frequency, flank pain and difficulty urinating.       No nocturia Pain in rectal area with sitting on hard chair.  Musculoskeletal: Positive for back pain.  Skin:       No itching or dryness.  Neurological:       No tingling or balance problems.  All other systems reviewed and are negative.       Objective:   Physical Exam  Genitourinary: Rectum normal, prostate normal and penis normal. Guaiac negative stool.       R>L Epididymus tender to palpation. Prostate nontender and firm. No hernias noted, no bulge felt.          Assessment & Plan:

## 2010-08-02 NOTE — Assessment & Plan Note (Signed)
Treat with Cipro 500 bid for 3 weeks. Recheck 4 weeks.

## 2010-08-02 NOTE — Patient Instructions (Signed)
RTC approx 3 weeks for recheck.

## 2010-08-27 ENCOUNTER — Encounter: Payer: Self-pay | Admitting: Family Medicine

## 2010-08-27 ENCOUNTER — Ambulatory Visit (INDEPENDENT_AMBULATORY_CARE_PROVIDER_SITE_OTHER): Payer: Medicare Other | Admitting: Family Medicine

## 2010-08-27 DIAGNOSIS — N453 Epididymo-orchitis: Secondary | ICD-10-CM

## 2010-08-27 DIAGNOSIS — N451 Epididymitis: Secondary | ICD-10-CM

## 2010-08-27 MED ORDER — CIPROFLOXACIN HCL 500 MG PO TABS: 500.0000 mg | ORAL_TABLET | Freq: Two times a day (BID) | ORAL | Status: AC

## 2010-08-27 NOTE — Assessment & Plan Note (Signed)
Extend Abs with Cipro another two weeks. Take regular IBP 200mg  3 after brfst lunch and dinner for one week. Recheck thereafter.

## 2010-08-27 NOTE — Patient Instructions (Signed)
RTC 2 weeks

## 2010-08-27 NOTE — Progress Notes (Signed)
  Subjective:    Patient ID: Richard Powell, male    DOB: April 19, 1945, 65 y.o.   MRN: [id number]  HPI Pt here for three week followup of acute epididymitis/prostatitis. He has done well with sxs improving but still notices discomfort with sitting on a hard surface or firm seat/chair. His sxs are still principally in the testicular area, now slightly worse on the left than the right. The sxs are significantly improved over three weeks ago. HE has not had fever and has tolerated his medication well.    Review of SystemsNoncontributory except as above.       Objective:   Physical Exam No exam        Assessment & Plan:

## 2010-09-15 ENCOUNTER — Encounter: Payer: Self-pay | Admitting: Family Medicine

## 2010-09-15 ENCOUNTER — Ambulatory Visit (INDEPENDENT_AMBULATORY_CARE_PROVIDER_SITE_OTHER): Payer: Medicare Other | Admitting: Family Medicine

## 2010-09-15 DIAGNOSIS — N453 Epididymo-orchitis: Secondary | ICD-10-CM

## 2010-09-15 DIAGNOSIS — N451 Epididymitis: Secondary | ICD-10-CM

## 2010-09-15 NOTE — Assessment & Plan Note (Signed)
Resolved except for local irritation with pressure. Will follow.

## 2010-09-15 NOTE — Progress Notes (Signed)
  Subjective:    Patient ID: Richard Powell, male    DOB: November 29, 1945, 65 y.o.   MRN: [id number]  HPI Pt here  for followup of presumed Epididymitis with renewed dosing of antibiotics after first recheck. He has also taken IBP regularly for one week for the inflammation.  He again tolerated the medication without difficulty.  His symptoms have been totally resolved since last being here until this AM when he got up from sitting in the hard bottomed chairs in our waiting room and had slight discomfort in the right scrotal area.     Review of SystemsNoncontributory except as above.       Objective:   Physical ExamWDWN WM NAD Testicles nml bilat, NT bilat no swelling noted.        Assessment & Plan:

## 2011-12-19 ENCOUNTER — Other Ambulatory Visit: Payer: Self-pay | Admitting: Family Medicine

## 2011-12-19 DIAGNOSIS — E785 Hyperlipidemia, unspecified: Secondary | ICD-10-CM

## 2011-12-19 DIAGNOSIS — Z125 Encounter for screening for malignant neoplasm of prostate: Secondary | ICD-10-CM

## 2011-12-19 DIAGNOSIS — R972 Elevated prostate specific antigen [PSA]: Secondary | ICD-10-CM

## 2011-12-20 ENCOUNTER — Other Ambulatory Visit (INDEPENDENT_AMBULATORY_CARE_PROVIDER_SITE_OTHER): Payer: Medicare Other

## 2011-12-20 DIAGNOSIS — Z125 Encounter for screening for malignant neoplasm of prostate: Secondary | ICD-10-CM

## 2011-12-20 DIAGNOSIS — E785 Hyperlipidemia, unspecified: Secondary | ICD-10-CM

## 2011-12-20 LAB — BASIC METABOLIC PANEL
Calcium: 9.5 mg/dL (ref 8.4–10.5)
GFR: 77.89 mL/min (ref >60.00)
Potassium: 4.7 mEq/L (ref 3.5–5.1)
Sodium: 136 mEq/L (ref 135–145)

## 2011-12-20 LAB — LIPID PANEL
Cholesterol: 177 mg/dL (ref 0–200)
HDL: 44.6 mg/dL (ref >39.00)
Triglycerides: 59 mg/dL (ref 0.0–149.0)
VLDL: 11.8 mg/dL (ref 0.0–40.0)

## 2011-12-20 LAB — PSA, MEDICARE: PSA: 6.12 ng/ml — ABNORMAL HIGH (ref 0.10–4.00)

## 2011-12-20 NOTE — Addendum Note (Signed)
Addended by: Baldomero Lamy on: 12/20/2011 07:53 AM   Modules accepted: Orders

## 2011-12-23 ENCOUNTER — Encounter: Payer: Self-pay | Admitting: Family Medicine

## 2011-12-23 ENCOUNTER — Ambulatory Visit (INDEPENDENT_AMBULATORY_CARE_PROVIDER_SITE_OTHER): Payer: Medicare Other | Admitting: Family Medicine

## 2011-12-23 VITALS — BP 140/90 | HR 60 | Temp 98.4°F | Ht 72.0 in | Wt 182.2 lb

## 2011-12-23 DIAGNOSIS — N400 Enlarged prostate without lower urinary tract symptoms: Secondary | ICD-10-CM

## 2011-12-23 DIAGNOSIS — Z23 Encounter for immunization: Secondary | ICD-10-CM | POA: Diagnosis not present

## 2011-12-23 DIAGNOSIS — F17200 Nicotine dependence, unspecified, uncomplicated: Secondary | ICD-10-CM

## 2011-12-23 DIAGNOSIS — E785 Hyperlipidemia, unspecified: Secondary | ICD-10-CM

## 2011-12-23 DIAGNOSIS — Z0000 Encounter for general adult medical examination without abnormal findings: Secondary | ICD-10-CM

## 2011-12-23 DIAGNOSIS — R972 Elevated prostate specific antigen [PSA]: Secondary | ICD-10-CM

## 2011-12-23 NOTE — Progress Notes (Signed)
Subjective:    Patient ID: Richard Powell, male    DOB: 19-Jan-1946, 66 y.o.   MRN: [id number]  HPI CC: medicare wellness visit  Having lower back pain intermittently, present every few years.  Improved with change in bed. On and off increased PSA in past.  Has never had prostate biopsy.  Longstanding h/o urinary trouble (5-6 yrs).  Nocturia 2-4x/night.  Has declined pharmacotherapy in past.  Occasional frequency and urgency and incomplete emptying.  No dysuria, hematuria. Asks about starting super beta prostate.  GERD - resolved with eating healthier.  Smoker 1/2 ppd.  Has quit in past.  Not recently.  Using e cig more.  Teacher, continues teaching computer at [employer]  Preventative: No recent CPE Colonoscopy 01/2010 - 1 polyp, hyperplastic, severe diverticulosis , rec rpt 5 yrs given fmhx. Prostate cancer screening - h/o elevated PSA in past.  No fmhx prostate cancer.  ++ BPH sxs. Td 2007 Flu shot - [facility] Pneumovax - 2013. zostavax - declines currently. Advanced directives: has living will at home.  HCPOA would be wife then oldest son.  Enjoys hobbies, denies sadness, anhedonia. Teaches computers at [employer] No falls in last year. Denies anhedonia, depression, sadness  Medications and allergies reviewed and updated in chart.  Past histories reviewed and updated if relevant as below. Patient Active Problem List  Diagnosis  . HYPERLIPIDEMIA  . ERECTILE DYSFUNCTION  . NICOTINE ADDICTION  . SLEEP APNEA  . ELEVATED PROSTATE SPECIFIC ANTIGEN  . Epididymitis, right   Past Medical History  Diagnosis Date  . Hyperlipemia     mild? pt denies  . Elevated PSA    Past Surgical History  Procedure Date  . Hand surgery     Left    History  Substance Use Topics  . Smoking status: Current Every Day Smoker -- 0.5 packs/day for 45 years    Types: Cigarettes  . Smokeless tobacco: Never Used  . Alcohol Use: Yes     beer on the weekends   Family History  Problem Relation Age of  Onset  . Cancer Mother 38    colon  . Cancer Maternal Grandmother     ? abd cancer  . CAD Neg Hx   . Stroke Neg Hx   . Diabetes Neg Hx    No Known Allergies Current Outpatient Prescriptions on File Prior to Visit  Medication Sig Dispense Refill  . ibuprofen (ADVIL,MOTRIN) 200 MG tablet Take 200 mg by mouth as needed.        . Melatonin 3 MG TABS Take by mouth at bedtime.           Review of Systems  Constitutional: Negative for fever, chills, activity change, appetite change, fatigue and unexpected weight change.  HENT: Negative for hearing loss and neck pain.   Eyes: Negative for visual disturbance.  Respiratory: Negative for cough, chest tightness, shortness of breath and wheezing.   Cardiovascular: Negative for chest pain, palpitations and leg swelling.  Gastrointestinal: Negative for nausea, vomiting, abdominal pain, diarrhea, constipation, blood in stool and abdominal distention.  Genitourinary: Negative for hematuria and difficulty urinating.  Musculoskeletal: Negative for myalgias and arthralgias.  Skin: Negative for rash.  Neurological: Negative for dizziness, seizures, syncope and headaches.  Hematological: Does not bruise/bleed easily.  Psychiatric/Behavioral: Negative for dysphoric mood. The patient is not nervous/anxious.        Objective:   Physical Exam  Nursing note and vitals reviewed. Constitutional: He is oriented to person, place, and time. He appears well-developed  and well-nourished. No distress.  HENT:  Head: Normocephalic and atraumatic.  Right Ear: Hearing, tympanic membrane, external ear and ear canal normal.  Left Ear: Hearing, tympanic membrane, external ear and ear canal normal.  Nose: Nose normal.  Mouth/Throat: Oropharynx is clear and moist. No oropharyngeal exudate.  Eyes: Conjunctivae normal and EOM are normal. Pupils are equal, round, and reactive to light. No scleral icterus.  Neck: Normal range of motion. Neck supple. No thyromegaly  present.  Cardiovascular: Normal rate, regular rhythm, normal heart sounds and intact distal pulses.   No murmur heard. Pulses:      Radial pulses are 2+ on the right side, and 2+ on the left side.  Pulmonary/Chest: Effort normal and breath sounds normal. No respiratory distress. He has no wheezes. He has no rales.  Abdominal: Soft. Bowel sounds are normal. He exhibits no distension and no mass. There is no tenderness. There is no rebound and no guarding.  Genitourinary: Rectum normal. Rectal exam shows no external hemorrhoid, no internal hemorrhoid, no fissure, no mass, no tenderness and anal tone normal. Prostate is enlarged (25gm). Prostate is not tender.       Smooth, no nodularity  Musculoskeletal: Normal range of motion. He exhibits no edema.  Lymphadenopathy:    He has no cervical adenopathy.  Neurological: He is alert and oriented to person, place, and time.       CN grossly intact, station and gait intact  Skin: Skin is warm and dry. No rash noted.  Psychiatric: He has a normal mood and affect. His behavior is normal. Judgment and thought content normal.       Assessment & Plan:

## 2011-12-23 NOTE — Patient Instructions (Addendum)
Think about baby aspirin enteric coated 81mg  daily or every other day. Pneumovax today. Work on quitting smoking! Best thing you can do for your health. Pass by Marion's office for referral for urologist (Dr. Vonita Moss) Good to see you today, call us with questions. Return in 1 year for physical or as needed

## 2011-12-25 ENCOUNTER — Encounter: Payer: Self-pay | Admitting: Family Medicine

## 2011-12-25 DIAGNOSIS — Z0000 Encounter for general adult medical examination without abnormal findings: Secondary | ICD-10-CM | POA: Insufficient documentation

## 2011-12-25 DIAGNOSIS — N400 Enlarged prostate without lower urinary tract symptoms: Secondary | ICD-10-CM | POA: Insufficient documentation

## 2011-12-25 NOTE — Assessment & Plan Note (Signed)
Symptomatic but pt declines therapy. Could possibly account for increased PSA. Will refer to urology.

## 2011-12-25 NOTE — Assessment & Plan Note (Signed)
Lab Results  Component Value Date   PSA 6.12* 12/20/2011   PSA 5.07* 03/03/2010   PSA 4.77* 12/07/2009  Trending up PSA per our charts over last several years.  Discussed this with pt.  Will refer to uro.

## 2011-12-25 NOTE — Assessment & Plan Note (Signed)
I have personally reviewed the Medicare Annual Wellness questionnaire and have noted 1. The patient's medical and social history 2. Their use of alcohol, tobacco or illicit drugs 3. Their current medications and supplements 4. The patient's functional ability including ADL's, fall risks, home safety risks and hearing or visual impairment. 5. Diet and physical activity 6. Evidence for depression or mood disorders The patients weight, height, BMI have been recorded in the chart.  Hearing and vision has been addressed. I have made referrals, counseling and provided education to the patient based review of the above and I have provided the pt with a written personalized care plan for preventive services. See scanned questionairre. Advanced directives discussed: discussed, asked he bring in living will.  Reviewed preventative protocols and updated unless pt declined. Colonoscopy UTD

## 2011-12-25 NOTE — Assessment & Plan Note (Signed)
Encouraged smoking cessation 

## 2011-12-25 NOTE — Assessment & Plan Note (Addendum)
Very mild, off chol meds. framingham risk = 16%. Discussed best thing for health is smoking cessation, recommended start aspirin at least qod.

## 2012-01-13 DIAGNOSIS — R972 Elevated prostate specific antigen [PSA]: Secondary | ICD-10-CM | POA: Diagnosis not present

## 2012-01-13 DIAGNOSIS — R351 Nocturia: Secondary | ICD-10-CM | POA: Diagnosis not present

## 2012-01-13 DIAGNOSIS — N401 Enlarged prostate with lower urinary tract symptoms: Secondary | ICD-10-CM | POA: Diagnosis not present

## 2012-02-24 DIAGNOSIS — R972 Elevated prostate specific antigen [PSA]: Secondary | ICD-10-CM | POA: Diagnosis not present

## 2012-03-30 DIAGNOSIS — R972 Elevated prostate specific antigen [PSA]: Secondary | ICD-10-CM | POA: Diagnosis not present

## 2012-04-03 ENCOUNTER — Encounter: Payer: Self-pay | Admitting: Family Medicine

## 2012-09-24 DIAGNOSIS — R972 Elevated prostate specific antigen [PSA]: Secondary | ICD-10-CM | POA: Diagnosis not present

## 2012-09-28 DIAGNOSIS — R972 Elevated prostate specific antigen [PSA]: Secondary | ICD-10-CM | POA: Diagnosis not present

## 2012-09-28 DIAGNOSIS — N401 Enlarged prostate with lower urinary tract symptoms: Secondary | ICD-10-CM | POA: Diagnosis not present

## 2012-10-01 ENCOUNTER — Encounter: Payer: Self-pay | Admitting: Family Medicine

## 2013-03-26 ENCOUNTER — Other Ambulatory Visit: Payer: Self-pay | Admitting: Family Medicine

## 2013-03-26 ENCOUNTER — Encounter: Payer: Self-pay | Admitting: Family Medicine

## 2013-03-26 DIAGNOSIS — R972 Elevated prostate specific antigen [PSA]: Secondary | ICD-10-CM

## 2013-03-26 DIAGNOSIS — E785 Hyperlipidemia, unspecified: Secondary | ICD-10-CM

## 2013-03-27 ENCOUNTER — Other Ambulatory Visit (INDEPENDENT_AMBULATORY_CARE_PROVIDER_SITE_OTHER): Payer: Medicare Other

## 2013-03-27 DIAGNOSIS — Z125 Encounter for screening for malignant neoplasm of prostate: Secondary | ICD-10-CM | POA: Diagnosis not present

## 2013-03-27 DIAGNOSIS — R972 Elevated prostate specific antigen [PSA]: Secondary | ICD-10-CM | POA: Diagnosis not present

## 2013-03-27 DIAGNOSIS — N400 Enlarged prostate without lower urinary tract symptoms: Secondary | ICD-10-CM | POA: Diagnosis not present

## 2013-03-27 DIAGNOSIS — E785 Hyperlipidemia, unspecified: Secondary | ICD-10-CM

## 2013-03-27 LAB — COMPREHENSIVE METABOLIC PANEL
ALT: 16 U/L (ref 0–53)
AST: 19 U/L (ref 0–37)
Albumin: 3.9 g/dL (ref 3.5–5.2)
Alkaline Phosphatase: 80 U/L (ref 39–117)
BILIRUBIN TOTAL: 0.6 mg/dL (ref 0.3–1.2)
BUN: 17 mg/dL (ref 6–23)
CHLORIDE: 103 meq/L (ref 96–112)
CO2: 24 meq/L (ref 19–32)
Calcium: 9.2 mg/dL (ref 8.4–10.5)
Creatinine, Ser: 1 mg/dL (ref 0.4–1.5)
GFR: 81.31 mL/min (ref >60.00)
Glucose, Bld: 111 mg/dL — ABNORMAL HIGH (ref 70–99)
Potassium: 4.4 mEq/L (ref 3.5–5.1)
SODIUM: 136 meq/L (ref 135–145)
TOTAL PROTEIN: 6.8 g/dL (ref 6.0–8.3)

## 2013-03-27 LAB — BASIC METABOLIC PANEL
BUN: 17 mg/dL (ref 6–23)
CHLORIDE: 103 meq/L (ref 96–112)
CO2: 24 mEq/L (ref 19–32)
Calcium: 9.2 mg/dL (ref 8.4–10.5)
Creatinine, Ser: 1 mg/dL (ref 0.4–1.5)
GFR: 81.31 mL/min (ref >60.00)
Glucose, Bld: 111 mg/dL — ABNORMAL HIGH (ref 70–99)
POTASSIUM: 4.4 meq/L (ref 3.5–5.1)
SODIUM: 136 meq/L (ref 135–145)

## 2013-03-27 LAB — PSA: PSA: 3.51 ng/mL (ref 0.10–4.00)

## 2013-03-29 ENCOUNTER — Ambulatory Visit (INDEPENDENT_AMBULATORY_CARE_PROVIDER_SITE_OTHER): Payer: Medicare Other | Admitting: Family Medicine

## 2013-03-29 ENCOUNTER — Encounter: Payer: Self-pay | Admitting: Family Medicine

## 2013-03-29 VITALS — BP 138/64 | HR 64 | Temp 98.0°F | Ht 72.0 in | Wt 184.8 lb

## 2013-03-29 DIAGNOSIS — Z0000 Encounter for general adult medical examination without abnormal findings: Secondary | ICD-10-CM

## 2013-03-29 DIAGNOSIS — E785 Hyperlipidemia, unspecified: Secondary | ICD-10-CM | POA: Diagnosis not present

## 2013-03-29 DIAGNOSIS — R972 Elevated prostate specific antigen [PSA]: Secondary | ICD-10-CM | POA: Diagnosis not present

## 2013-03-29 DIAGNOSIS — F17200 Nicotine dependence, unspecified, uncomplicated: Secondary | ICD-10-CM | POA: Diagnosis not present

## 2013-03-29 DIAGNOSIS — N400 Enlarged prostate without lower urinary tract symptoms: Secondary | ICD-10-CM

## 2013-03-29 NOTE — Assessment & Plan Note (Signed)
Down to <1/2 ppd.  Has used e cigarettes and is considering vaporized nicotine. Prior smoked up to 1 ppd, started smoking 1960s.  >50 PY history Discussed lung cancer screening with CXR vs CT scan and how pt would qualify based on guidelines. Discussed insurance may not cover this and cost could be up to $300. Pt would like to undergo lung cancer screening with low resolution CT scan.  I have ordered this today.

## 2013-03-29 NOTE — Assessment & Plan Note (Signed)
Stable off meds. Continue to encourage smoking cessation.  On aspirin 3 times a week

## 2013-03-29 NOTE — Assessment & Plan Note (Addendum)
I have personally reviewed the Medicare Annual Wellness questionnaire and have noted 1. The patient's medical and social history 2. Their use of alcohol, tobacco or illicit drugs 3. Their current medications and supplements 4. The patient's functional ability including ADL's, fall risks, home safety risks and hearing or visual impairment. 5. Diet and physical activity 6. Evidence for depression or mood disorders The patients weight, height, BMI have been recorded in the chart.  Hearing and vision has been addressed. I have made referrals, counseling and provided education to the patient based review of the above and I have provided the pt with a written personalized care plan for preventive services. See scanned questionairre. Advanced directives discussed: has living will at home.  Reviewed preventative protocols and updated unless pt declined. Discussed lung cancer screening - see below.   UTD immunizations. It seems like BMP and CMP have been accidentally both collected - I have asked to credit BMP.

## 2013-03-29 NOTE — Progress Notes (Signed)
Pre-visit discussion using our clinic review tool. No additional management support is needed unless otherwise documented below in the visit note.  

## 2013-03-29 NOTE — Assessment & Plan Note (Signed)
Continue finasteride.  Followed by urology.

## 2013-03-29 NOTE — Assessment & Plan Note (Addendum)
Has been followed by Dr. Junious Silk urology, most likely due to BPH given recent negative prostate biopsy - appreciate their assistance. Provided with PSA today to take to his upcoming urology appointment.

## 2013-03-29 NOTE — Progress Notes (Signed)
BP 138/64  Pulse 64  Temp(Src) 98 F (36.7 C) (Oral)  Ht 6' (1.829 m)  Wt 184 lb 12 oz (83.802 kg)  BMI 25.05 kg/m2   CC: medicare wellness visit  Subjective:    Patient ID: Richard Powell, male    DOB: 07-Mar-1945, 68 y.o.   MRN: [id number]  HPI: Richard Powell is a 68 y.o. male presenting on 03/29/2013 with Annual Exam  Medicare wellness visit.  Smoker - <1/2 ppd.  Considering vapor cigarettes.  Has used Ecigarettes.  No falls in last year.  Denies anhedonia, depression, sadness  Passes hearing screen today. Vision screen this morning.  Preventative:  Colonoscopy 01/2010 - 1 polyp, hyperplastic, severe diverticulosis , rec rpt 5 yrs given fmhx.  Prostate cancer screening - h/o elevated PSA in past. No fmhx prostate cancer. ++ BPH sxs. Follows with Dr. Junious Silk.  Has f/u with him in 1 wk. Lab Results  Component Value Date   PSA 3.51 03/27/2013   PSA 6.12* 12/20/2011   PSA 5.07* 03/03/2010  Td 2007  [facility] shot - [facility]  Pneumovax - 2013.  zostavax - declines currently.  Advanced directives: has living will at home. HCPOA would be wife then oldest son.   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 Allergies and medications reviewed and updated. Current Outpatient Prescriptions on File Prior to Visit  Medication Sig  . Multiple Vitamins-Minerals (MULTIVITAMIN PO) Take 1 tablet by mouth daily.  Marland Kitchen ibuprofen (ADVIL,MOTRIN) 200 MG tablet Take 200 mg by mouth as needed.     No current facility-administered medications on file prior to visit.    Review of Systems  Constitutional: Negative for fever, chills, activity change, appetite change, fatigue and unexpected weight change.  HENT: Negative for hearing loss.   Eyes: Negative  for visual disturbance.  Respiratory: Negative for cough, chest tightness, shortness of breath and wheezing.   Cardiovascular: Negative for chest pain, palpitations and leg swelling.  Gastrointestinal: Negative for nausea, vomiting, abdominal pain, diarrhea, constipation, blood in stool and abdominal distention.  Genitourinary: Negative for hematuria and difficulty urinating.  Musculoskeletal: Negative for arthralgias, myalgias and neck pain.  Skin: Negative for rash.  Neurological: Negative for dizziness, seizures, syncope and headaches.  Hematological: Negative for adenopathy. Does not bruise/bleed easily.  Psychiatric/Behavioral: Negative for dysphoric mood. The patient is not nervous/anxious.    Per HPI unless specifically indicated above    Objective:    BP 138/64  Pulse 64  Temp(Src) 98 F (36.7 C) (Oral)  Ht 6' (1.829 m)  Wt 184 lb 12 oz (83.802 kg)  BMI 25.05 kg/m2  Physical Exam  Nursing note and vitals reviewed. Constitutional: He is oriented to person, place, and time. He appears well-developed and well-nourished. No distress.  HENT:  Head: Normocephalic and atraumatic.  Right Ear: Hearing, tympanic membrane, external ear and ear canal normal.  Left Ear: Hearing, tympanic membrane, external ear and ear canal normal.  Nose: Nose normal.  Mouth/Throat: Uvula is midline, oropharynx is clear and moist and mucous membranes are normal. No oropharyngeal exudate, posterior oropharyngeal edema or posterior oropharyngeal erythema.  Eyes: Conjunctivae and EOM are normal. Pupils are equal, round, and reactive to light. No scleral icterus.  Neck: Normal range of motion. Neck supple. Carotid bruit is not present. No  thyromegaly present.  Cardiovascular: Normal rate, regular rhythm, normal heart sounds and intact distal pulses.   No murmur heard. Pulses:      Radial pulses are 2+ on the right side, and 2+ on the left side.  Pulmonary/Chest: Effort normal and breath sounds normal. No  respiratory distress. He has no wheezes. He has no rales.  Coarse breath sounds localized over left mid lung field  Abdominal: Soft. Bowel sounds are normal. He exhibits no distension and no mass. There is no tenderness. There is no rebound and no guarding.  Genitourinary:  deferred  Musculoskeletal: Normal range of motion. He exhibits no edema.  Lymphadenopathy:    He has no cervical adenopathy.  Neurological: He is alert and oriented to person, place, and time.  CN grossly intact, station and gait intact  Skin: Skin is warm and dry. No rash noted.  Psychiatric: He has a normal mood and affect. His behavior is normal. Judgment and thought content normal.   Results for orders placed in visit on 76/19/50  BASIC METABOLIC PANEL      Result Value Range   Sodium 136  135 - 145 mEq/L   Potassium 4.4  3.5 - 5.1 mEq/L   Chloride 103  96 - 112 mEq/L   CO2 24  19 - 32 mEq/L   Glucose, Bld 111 (*) 70 - 99 mg/dL   BUN 17  6 - 23 mg/dL   Creatinine, Ser 1.0  0.4 - 1.5 mg/dL   Calcium 9.2  8.4 - 10.5 mg/dL   GFR 81.31  >60.00 mL/min  COMPREHENSIVE METABOLIC PANEL      Result Value Range   Sodium 136  135 - 145 mEq/L   Potassium 4.4  3.5 - 5.1 mEq/L   Chloride 103  96 - 112 mEq/L   CO2 24  19 - 32 mEq/L   Glucose, Bld 111 (*) 70 - 99 mg/dL   BUN 17  6 - 23 mg/dL   Creatinine, Ser 1.0  0.4 - 1.5 mg/dL   Total Bilirubin 0.6  0.3 - 1.2 mg/dL   Alkaline Phosphatase 80  39 - 117 U/L   AST 19  0 - 37 U/L   ALT 16  0 - 53 U/L   Total Protein 6.8  6.0 - 8.3 g/dL   Albumin 3.9  3.5 - 5.2 g/dL   Calcium 9.2  8.4 - 10.5 mg/dL   GFR 81.31  >60.00 mL/min  PSA      Result Value Range   PSA 3.51  0.10 - 4.00 ng/mL      Assessment & Plan:   Problem List Items Addressed This Visit   BPH (benign prostatic hypertrophy)     Continue finasteride.  Followed by urology.    Relevant Medications      finasteride (PROSCAR) 5 MG tablet   ELEVATED PROSTATE SPECIFIC ANTIGEN     Has been followed by Dr.  Junious Silk urology, most likely due to BPH given recent negative prostate biopsy - appreciate their assistance. Provided with PSA today.    HYPERLIPIDEMIA     Stable off meds. Continue to encourage smoking cessation.  On aspirin 3 times a week    Relevant Medications      aspirin 81 MG tablet   Medicare annual wellness visit, subsequent - Primary     I have personally reviewed the Medicare Annual Wellness questionnaire and have noted 1. The patient's medical and social history 2. Their use of alcohol, tobacco or illicit drugs  3. Their current medications and supplements 4. The patient's functional ability including ADL's, fall risks, home safety risks and hearing or visual impairment. 5. Diet and physical activity 6. Evidence for depression or mood disorders The patients weight, height, BMI have been recorded in the chart.  Hearing and vision has been addressed. I have made referrals, counseling and provided education to the patient based review of the above and I have provided the pt with a written personalized care plan for preventive services. See scanned questionairre. Advanced directives discussed: has living will at home.  Reviewed preventative protocols and updated unless pt declined. Discussed lung cancer screening - see below.   UTD immunizations. It seems like BMP and CMP have been accidentally both collected - I have asked to credit BMP.    Smoker     Down to <1/2 ppd.  Has used e cigarettes and is considering vaporized nicotine. Prior smoked up to 1 ppd, started smoking 1960s.  >50 PY history Discussed lung cancer screening with CXR vs CT scan and how pt would qualify based on guidelines. Discussed insurance may not cover this and cost could be up to $300. Pt would like to undergo lung cancer screening with low resolution CT scan.  I have ordered this today.    Relevant Orders      CT Chest Wo Contrast       Follow up plan: Return in about 1 year (around 03/29/2014), or  as needed, for annual exam, prior fasting for blood work.

## 2013-03-29 NOTE — Patient Instructions (Signed)
Good to see you today, call us with questions. Return as needed or in 1 year for next wellness exam We will call you next week to set up screening CT scan of lungs.

## 2013-04-01 ENCOUNTER — Telehealth: Payer: Self-pay | Admitting: Family Medicine

## 2013-04-01 NOTE — Telephone Encounter (Signed)
Relevant patient education assigned to patient using Emmi. ° °

## 2013-04-02 ENCOUNTER — Ambulatory Visit: Payer: Medicare Other

## 2013-04-02 DIAGNOSIS — E785 Hyperlipidemia, unspecified: Secondary | ICD-10-CM

## 2013-04-02 DIAGNOSIS — R972 Elevated prostate specific antigen [PSA]: Secondary | ICD-10-CM | POA: Diagnosis not present

## 2013-04-02 LAB — LIPID PANEL
CHOL/HDL RATIO: 4
Cholesterol: 197 mg/dL (ref 0–200)
HDL: 44 mg/dL (ref >39.00)
LDL Cholesterol: 125 mg/dL — ABNORMAL HIGH (ref 0–99)
Triglycerides: 141 mg/dL (ref 0.0–149.0)
VLDL: 28.2 mg/dL (ref 0.0–40.0)

## 2013-04-04 ENCOUNTER — Ambulatory Visit (INDEPENDENT_AMBULATORY_CARE_PROVIDER_SITE_OTHER)
Admission: RE | Admit: 2013-04-04 | Discharge: 2013-04-04 | Disposition: A | Discharge status: Home / Self Care | Payer: Self-pay | Source: Ambulatory Visit | Attending: Family Medicine | Admitting: Family Medicine

## 2013-04-04 DIAGNOSIS — F17200 Nicotine dependence, unspecified, uncomplicated: Secondary | ICD-10-CM

## 2013-04-05 ENCOUNTER — Encounter: Payer: Self-pay | Admitting: Family Medicine

## 2013-04-05 DIAGNOSIS — R972 Elevated prostate specific antigen [PSA]: Secondary | ICD-10-CM | POA: Diagnosis not present

## 2013-04-05 DIAGNOSIS — N139 Obstructive and reflux uropathy, unspecified: Secondary | ICD-10-CM | POA: Diagnosis not present

## 2013-04-05 DIAGNOSIS — N529 Male erectile dysfunction, unspecified: Secondary | ICD-10-CM | POA: Diagnosis not present

## 2013-04-05 DIAGNOSIS — N401 Enlarged prostate with lower urinary tract symptoms: Secondary | ICD-10-CM | POA: Diagnosis not present

## 2013-04-07 ENCOUNTER — Encounter: Payer: Self-pay | Admitting: Family Medicine

## 2013-04-08 ENCOUNTER — Other Ambulatory Visit: Payer: Self-pay | Admitting: Family Medicine

## 2013-04-08 MED ORDER — ATORVASTATIN CALCIUM 20 MG PO TABS: 20.0000 mg | ORAL_TABLET | Freq: Every day | ORAL | Status: DC

## 2013-04-11 ENCOUNTER — Encounter: Payer: Self-pay | Admitting: Family Medicine

## 2013-10-03 DIAGNOSIS — R972 Elevated prostate specific antigen [PSA]: Secondary | ICD-10-CM | POA: Diagnosis not present

## 2013-10-07 DIAGNOSIS — N139 Obstructive and reflux uropathy, unspecified: Secondary | ICD-10-CM | POA: Diagnosis not present

## 2013-10-07 DIAGNOSIS — R972 Elevated prostate specific antigen [PSA]: Secondary | ICD-10-CM | POA: Diagnosis not present

## 2013-10-07 DIAGNOSIS — N401 Enlarged prostate with lower urinary tract symptoms: Secondary | ICD-10-CM | POA: Diagnosis not present

## 2013-10-07 DIAGNOSIS — N529 Male erectile dysfunction, unspecified: Secondary | ICD-10-CM | POA: Diagnosis not present

## 2013-10-14 ENCOUNTER — Encounter: Payer: Self-pay | Admitting: Family Medicine

## 2014-03-22 ENCOUNTER — Other Ambulatory Visit: Payer: Self-pay | Admitting: Family Medicine

## 2014-03-27 ENCOUNTER — Other Ambulatory Visit: Payer: Self-pay | Admitting: Family Medicine

## 2014-03-27 DIAGNOSIS — E785 Hyperlipidemia, unspecified: Secondary | ICD-10-CM

## 2014-03-27 DIAGNOSIS — R972 Elevated prostate specific antigen [PSA]: Secondary | ICD-10-CM

## 2014-03-27 DIAGNOSIS — N400 Enlarged prostate without lower urinary tract symptoms: Secondary | ICD-10-CM

## 2014-03-28 ENCOUNTER — Other Ambulatory Visit (INDEPENDENT_AMBULATORY_CARE_PROVIDER_SITE_OTHER): Payer: Medicare Other

## 2014-03-28 DIAGNOSIS — R972 Elevated prostate specific antigen [PSA]: Secondary | ICD-10-CM

## 2014-03-28 DIAGNOSIS — E785 Hyperlipidemia, unspecified: Secondary | ICD-10-CM | POA: Diagnosis not present

## 2014-03-28 DIAGNOSIS — N400 Enlarged prostate without lower urinary tract symptoms: Secondary | ICD-10-CM

## 2014-03-28 LAB — LIPID PANEL
Cholesterol: 128 mg/dL (ref 0–200)
HDL: 44.3 mg/dL (ref >39.00)
LDL Cholesterol: 63 mg/dL (ref 0–99)
NONHDL: 83.7
TRIGLYCERIDES: 102 mg/dL (ref 0.0–149.0)
Total CHOL/HDL Ratio: 3
VLDL: 20.4 mg/dL (ref 0.0–40.0)

## 2014-03-28 LAB — BASIC METABOLIC PANEL
BUN: 15 mg/dL (ref 6–23)
CHLORIDE: 105 meq/L (ref 96–112)
CO2: 29 mEq/L (ref 19–32)
Calcium: 9.8 mg/dL (ref 8.4–10.5)
Creatinine, Ser: 1 mg/dL (ref 0.40–1.50)
GFR: 78.27 mL/min (ref >60.00)
Glucose, Bld: 98 mg/dL (ref 70–99)
Potassium: 4.8 mEq/L (ref 3.5–5.1)
SODIUM: 139 meq/L (ref 135–145)

## 2014-03-28 LAB — PSA: PSA: 2.2 ng/mL (ref 0.10–4.00)

## 2014-03-31 ENCOUNTER — Ambulatory Visit (INDEPENDENT_AMBULATORY_CARE_PROVIDER_SITE_OTHER): Payer: Medicare Other | Admitting: Family Medicine

## 2014-03-31 ENCOUNTER — Encounter: Payer: Self-pay | Admitting: Family Medicine

## 2014-03-31 VITALS — BP 130/70 | HR 68 | Temp 98.0°F | Ht 72.0 in | Wt 184.0 lb

## 2014-03-31 DIAGNOSIS — N400 Enlarged prostate without lower urinary tract symptoms: Secondary | ICD-10-CM

## 2014-03-31 DIAGNOSIS — Z7189 Other specified counseling: Secondary | ICD-10-CM

## 2014-03-31 DIAGNOSIS — H6123 Impacted cerumen, bilateral: Secondary | ICD-10-CM

## 2014-03-31 DIAGNOSIS — J449 Chronic obstructive pulmonary disease, unspecified: Secondary | ICD-10-CM | POA: Diagnosis not present

## 2014-03-31 DIAGNOSIS — I2510 Atherosclerotic heart disease of native coronary artery without angina pectoris: Secondary | ICD-10-CM

## 2014-03-31 DIAGNOSIS — H6120 Impacted cerumen, unspecified ear: Secondary | ICD-10-CM | POA: Insufficient documentation

## 2014-03-31 DIAGNOSIS — Z23 Encounter for immunization: Secondary | ICD-10-CM

## 2014-03-31 DIAGNOSIS — F17200 Nicotine dependence, unspecified, uncomplicated: Secondary | ICD-10-CM

## 2014-03-31 DIAGNOSIS — E785 Hyperlipidemia, unspecified: Secondary | ICD-10-CM | POA: Diagnosis not present

## 2014-03-31 DIAGNOSIS — Z720 Tobacco use: Secondary | ICD-10-CM

## 2014-03-31 DIAGNOSIS — Z0000 Encounter for general adult medical examination without abnormal findings: Secondary | ICD-10-CM | POA: Diagnosis not present

## 2014-03-31 NOTE — Assessment & Plan Note (Signed)
L>R - right side cleared well. Incomplete L irrigation today - discussed home care with dilute hydrogen peroxide and return here in a few weeks if desires repeated irrigation. Pt agrees with plan.

## 2014-03-31 NOTE — Addendum Note (Signed)
Addended by: Ria Bush on: 03/31/2014 01:22 PM   Modules accepted: Level of Service, SmartSet

## 2014-03-31 NOTE — Progress Notes (Addendum)
BP 130/70 mmHg  Pulse 68  Temp(Src) 98 F (36.7 C) (Oral)  Ht 6' (1.829 m)  Wt 184 lb (83.462 kg)  BMI 24.95 kg/m2   CC: medicare wellness visit  Subjective:    Patient ID: Richard Powell, male    DOB: 1945/05/08, 69 y.o.   MRN: [id number]  HPI: Richard Powell is a 69 y.o. male presenting on 03/31/2014 for Annual Exam   Retired last June 2015. Getting accustomed to this still.  Smoker - <1/2 ppd.using nicotine tablets. Continues cutting back slowly.  Tolerating lipitor 20mg  well - was started for ATH found on CT chest. Has decreased snacking.  Failed hearing screen today. Denies trouble with this. Endorses chronic L ear canal cerumen impaction Vision screen with eye doctor 02/2014. No falls in last year.  Denies anhedonia, depression, sadness   Preventative: Colonoscopy 01/2010 - 1 polyp, hyperplastic, severe diverticulosis , rec rpt 5 yrs given fmhx. Sharlett Iles).  Prostate cancer screening - h/o elevated PSA in past. No fmhx prostate cancer. ++ BPH sxs. Follows with Dr. Junious Silk yearly in august. Lung cancer screening - s/p LRCT scan of lungs last year showing COPD changes but no nodules or other evidence of cancer.  Td 2007  [facility]  Pneumovax - 2013. prevnar today zostavax - will receive at pharmacy.  Advanced directives: has living will at home. HCPOA would be wife then oldest son.   Married, lives with wife Children 3- out of home Occupation: Engineer, civil (consulting) at [employer], retired June 2015 Activity: stays active at home, walking more, golfing Diet: some water, fruits/vegetables daily   CT CHEST SCREENING WITHOUT CONTRAST TECHNIQUE: Multidetector CT imaging of the chest was performed following the standard low-dose protocol without IV contrast.  COMPARISON: No priors.  FINDINGS: Mediastinum: Heart size is normal. There is no significant pericardial fluid, thickening or pericardial calcification. There is atherosclerosis of the thoracic aorta, the  great vessels of the mediastinum and the coronary arteries, including calcified atherosclerotic plaque in the left anterior descending, left circumflex and right coronary arteries. No pathologically enlarged mediastinal or hilar lymph nodes. Please note that accurate exclusion of hilar adenopathy is limited on noncontrast CT scans. Esophagus is unremarkable in appearance.  Lungs/Pleura: Sub-cm calcified granulomas in the left upper lobe and inferior aspect of the right lower lobe. 6 x 3 mm ovoid shaped subpleural nodule associated with the left major fissure (image 230 of series 5). No other larger more suspicious appearing pulmonary nodules or masses are otherwise noted. There is some mild bilateral apical nodular pleuroparenchymal thickening, most compatible with mild chronic scarring from prior infection or inflammation. Mild diffuse bronchial wall thickening. Mild centrilobular and paraseptal emphysema, most pronounced in the lung apices.  Upper Abdomen: Unremarkable.  Musculoskeletal: There are no aggressive appearing lytic or blastic lesions noted in the visualized portions of the skeleton.  IMPRESSION: 1. Lung-RADS Category 2, benign appearance or behavior. Continue annual screening with low-dose chest CT without contrast in 12 months. 2. Mild diffuse bronchial wall thickening with mild centrilobular and paraseptal emphysema; imaging findings suggestive of underlying COPD. 3. Atherosclerosis, including three-vessel coronary artery disease. Assessment for potential risk factor modification, dietary therapy or pharmacologic therapy may be warranted, if clinically indicated. 23. Sequela of old granulomatous disease, as above. These results will be called to the ordering clinician or representative by the Radiologist Assistant, and communication documented in the PACS Dashboard. Electronically Signed  By: Vinnie Langton M.D.  On: 04/04/2013 16:27  Relevant past medical, surgical,  family  and social history reviewed and updated as indicated. Interim medical history since our last visit reviewed. Allergies and medications reviewed and updated. Current Outpatient Prescriptions on File Prior to Visit  Medication Sig  . aspirin 81 MG tablet Take 81 mg by mouth 3 (three) times a week.  Marland Kitchen atorvastatin (LIPITOR) 20 MG tablet TAKE 1 TABLET DAILY  . finasteride (PROSCAR) 5 MG tablet Take 5 mg by mouth daily.  Marland Kitchen ibuprofen (ADVIL,MOTRIN) 200 MG tablet Take 200 mg by mouth as needed.    . Multiple Vitamins-Minerals (MULTIVITAMIN PO) Take 1 tablet by mouth daily.   No current facility-administered medications on file prior to visit.    Review of Systems Per HPI unless specifically indicated above     Objective:    BP 130/70 mmHg  Pulse 68  Temp(Src) 98 F (36.7 C) (Oral)  Ht 6' (1.829 m)  Wt 184 lb (83.462 kg)  BMI 24.95 kg/m2  Wt Readings from Last 3 Encounters:  03/31/14 184 lb (83.462 kg)  03/29/13 184 lb 12 oz (83.802 kg)  12/23/11 182 lb 4 oz (82.668 kg)    Physical Exam  Constitutional: He is oriented to person, place, and time. He appears well-developed and well-nourished. No distress.  HENT:  Head: Normocephalic and atraumatic.  Right Ear: Hearing and ear canal normal.  Left Ear: Hearing and ear canal normal.  Nose: Nose normal.  Mouth/Throat: Uvula is midline, oropharynx is clear and moist and mucous membranes are normal. No oropharyngeal exudate, posterior oropharyngeal edema or posterior oropharyngeal erythema.  Cerumen impaction bilaterally, irrigation performed and tolerated well. Successful on right ear, incomplete on left ear with residual thick cerumen plug deep in eardrum  Eyes: Conjunctivae and EOM are normal. Pupils are equal, round, and reactive to light. No scleral icterus.  Neck: Normal range of motion. Neck supple. Carotid bruit is not present. No thyromegaly present.  Cardiovascular: Normal rate, regular rhythm, normal heart sounds and intact  distal pulses.   No murmur heard. Pulses:      Radial pulses are 2+ on the right side, and 2+ on the left side.  Pulmonary/Chest: Effort normal and breath sounds normal. No respiratory distress. He has no wheezes. He has no rales.  Abdominal: Soft. Bowel sounds are normal. He exhibits no distension and no mass. There is no tenderness. There is no rebound and no guarding.  Genitourinary:  Deferred - per urology  Musculoskeletal: Normal range of motion. He exhibits no edema.  Lymphadenopathy:    He has no cervical adenopathy.  Neurological: He is alert and oriented to person, place, and time.  CN grossly intact, station and gait intact recall 3/3  Calculation 5/ 5serial 7s  Skin: Skin is warm and dry. No rash noted.  Psychiatric: He has a normal mood and affect. His behavior is normal. Judgment and thought content normal.  Nursing note and vitals reviewed.  Results for orders placed or performed in visit on 03/28/14  Lipid panel  Result Value Ref Range   Cholesterol 128 0 - 200 mg/dL   Triglycerides 102.0 0.0 - 149.0 mg/dL   HDL 44.30 >39.00 mg/dL   VLDL 20.4 0.0 - 40.0 mg/dL   LDL Cholesterol 63 0 - 99 mg/dL   Total CHOL/HDL Ratio 3    NonHDL 93.23   Basic metabolic panel  Result Value Ref Range   Sodium 139 135 - 145 mEq/L   Potassium 4.8 3.5 - 5.1 mEq/L   Chloride 105 96 - 112 mEq/L  CO2 29 19 - 32 mEq/L   Glucose, Bld 98 70 - 99 mg/dL   BUN 15 6 - 23 mg/dL   Creatinine, Ser 1.00 0.40 - 1.50 mg/dL   Calcium 9.8 8.4 - 10.5 mg/dL   GFR 78.27 >60.00 mL/min  PSA  Result Value Ref Range   PSA 2.20 0.10 - 4.00 ng/mL      Assessment & Plan:   Problem List Items Addressed This Visit    Smoker    Continue to encourage smoking cessation down to <1/2 ppd      Medicare annual wellness visit, subsequent - Primary    I have personally reviewed the Medicare Annual Wellness questionnaire and have noted 1. The patient's medical and social history 2. Their use of alcohol,  tobacco or illicit drugs 3. Their current medications and supplements 4. The patient's functional ability including ADL's, fall risks, home safety risks and hearing or visual impairment. 5. Diet and physical activity 6. Evidence for depression or mood disorders The patients weight, height, BMI have been recorded in the chart.  Hearing and vision has been addressed. I have made referrals, counseling and provided education to the patient based review of the above and I have provided the pt with a written personalized care plan for preventive services. Provider list updated - see scanned questionairre. Reviewed preventative protocols and updated unless pt declined.       HLD (hyperlipidemia)    Started lipitor 20mg  daily last year 2/2 CT lung findings (3v CAD). Tolerating well. Also encouraged continue aspirin 81mg  MWF.      COPD (chronic obstructive pulmonary disease)    By CT. But pt denies dyspnea or wheeze, and able to stay as active as he'd like - will continue to monitor.      Cerumen impaction    L>R - right side cleared well. Incomplete L irrigation today - discussed home care with dilute hydrogen peroxide and return here in a few weeks if desires repeated irrigation. Pt agrees with plan.      CAD (coronary artery disease), native coronary artery    By CT scan - of coronary arteries (LAD, Lcirc, RCA) and thoracic aorta. Now on lipitor 20mg  daily      BPH (benign prostatic hypertrophy)    Continue finasteride. Followed by Dr Junious Silk      Advanced care planning/counseling discussion    Advanced directives: has living will at home. HCPOA would be wife then oldest son.        Other Visit Diagnoses    Need for vaccination with 13-polyvalent pneumococcal conjugate vaccine        Relevant Orders    Pneumococcal conjugate vaccine 13-valent (Completed)        Follow up plan: Return in about 1 year (around 04/01/2015), or as needed, for medicare wellness.

## 2014-03-31 NOTE — Patient Instructions (Addendum)
prevnar today. Wait at least 1 month then may go to pharmacy for shingles shot (zostavax).  Bring me a copy of living will to update your chart. Return as needed or in 1 year for next wellness visit Work on quitting smoking - best thing for your health Good to see you today, call us with questions.

## 2014-03-31 NOTE — Assessment & Plan Note (Signed)
By CT scan - of coronary arteries (LAD, Lcirc, RCA) and thoracic aorta. Now on lipitor 20mg  daily

## 2014-03-31 NOTE — Assessment & Plan Note (Signed)
By CT. But pt denies dyspnea or wheeze, and able to stay as active as he'd like - will continue to monitor.

## 2014-03-31 NOTE — Assessment & Plan Note (Signed)
Continue finasteride. Followed by Dr Junious Silk

## 2014-03-31 NOTE — Progress Notes (Signed)
Pre visit review using our clinic review tool, if applicable. No additional management support is needed unless otherwise documented below in the visit note. 

## 2014-03-31 NOTE — Addendum Note (Signed)
Addended by: Royann Shivers A on: 03/31/2014 12:11 PM   Modules accepted: Orders

## 2014-03-31 NOTE — Assessment & Plan Note (Signed)

## 2014-03-31 NOTE — Assessment & Plan Note (Signed)
Advanced directives: has living will at home. HCPOA would be wife then oldest son.

## 2014-03-31 NOTE — Assessment & Plan Note (Signed)
Continue to encourage smoking cessation down to <1/2 ppd

## 2014-03-31 NOTE — Assessment & Plan Note (Signed)
Started lipitor 20mg  daily last year 2/2 CT lung findings (3v CAD). Tolerating well. Also encouraged continue aspirin 81mg  MWF.

## 2014-10-10 DIAGNOSIS — R351 Nocturia: Secondary | ICD-10-CM | POA: Diagnosis not present

## 2014-10-10 DIAGNOSIS — N138 Other obstructive and reflux uropathy: Secondary | ICD-10-CM | POA: Diagnosis not present

## 2014-10-10 DIAGNOSIS — N401 Enlarged prostate with lower urinary tract symptoms: Secondary | ICD-10-CM | POA: Diagnosis not present

## 2014-10-10 DIAGNOSIS — N5201 Erectile dysfunction due to arterial insufficiency: Secondary | ICD-10-CM | POA: Diagnosis not present

## 2015-01-28 ENCOUNTER — Encounter: Payer: Self-pay | Admitting: Internal Medicine

## 2015-02-27 ENCOUNTER — Other Ambulatory Visit: Payer: Self-pay | Admitting: Family Medicine

## 2015-03-25 ENCOUNTER — Telehealth: Payer: Self-pay

## 2015-03-25 NOTE — Telephone Encounter (Signed)
Attempted to reach mbr to discuss AWV

## 2015-03-30 ENCOUNTER — Encounter: Payer: Self-pay | Admitting: Internal Medicine

## 2015-04-01 ENCOUNTER — Other Ambulatory Visit: Payer: Self-pay | Admitting: Family Medicine

## 2015-04-01 ENCOUNTER — Other Ambulatory Visit (INDEPENDENT_AMBULATORY_CARE_PROVIDER_SITE_OTHER): Payer: Medicare Other

## 2015-04-01 DIAGNOSIS — N400 Enlarged prostate without lower urinary tract symptoms: Secondary | ICD-10-CM

## 2015-04-01 DIAGNOSIS — E785 Hyperlipidemia, unspecified: Secondary | ICD-10-CM

## 2015-04-01 DIAGNOSIS — Z1159 Encounter for screening for other viral diseases: Secondary | ICD-10-CM

## 2015-04-01 DIAGNOSIS — R972 Elevated prostate specific antigen [PSA]: Secondary | ICD-10-CM

## 2015-04-01 LAB — BASIC METABOLIC PANEL
BUN: 18 mg/dL (ref 6–23)
CO2: 28 meq/L (ref 19–32)
Calcium: 9.8 mg/dL (ref 8.4–10.5)
Chloride: 104 mEq/L (ref 96–112)
Creatinine, Ser: 1.02 mg/dL (ref 0.40–1.50)
GFR: 76.28 mL/min (ref >60.00)
GLUCOSE: 101 mg/dL — AB (ref 70–99)
POTASSIUM: 4.8 meq/L (ref 3.5–5.1)
SODIUM: 138 meq/L (ref 135–145)

## 2015-04-01 LAB — LIPID PANEL
CHOLESTEROL: 131 mg/dL (ref 0–200)
HDL: 46.3 mg/dL (ref >39.00)
LDL Cholesterol: 68 mg/dL (ref 0–99)
NonHDL: 84.45
TRIGLYCERIDES: 81 mg/dL (ref 0.0–149.0)
Total CHOL/HDL Ratio: 3
VLDL: 16.2 mg/dL (ref 0.0–40.0)

## 2015-04-01 LAB — PSA: PSA: 2.54 ng/mL (ref 0.10–4.00)

## 2015-04-02 LAB — HEPATITIS C ANTIBODY: HCV AB: NEGATIVE

## 2015-04-03 ENCOUNTER — Ambulatory Visit (INDEPENDENT_AMBULATORY_CARE_PROVIDER_SITE_OTHER): Payer: Medicare Other | Admitting: Family Medicine

## 2015-04-03 ENCOUNTER — Encounter: Payer: Self-pay | Admitting: Family Medicine

## 2015-04-03 VITALS — BP 128/60 | HR 59 | Temp 98.3°F | Ht 72.0 in | Wt 179.0 lb

## 2015-04-03 DIAGNOSIS — Z0000 Encounter for general adult medical examination without abnormal findings: Secondary | ICD-10-CM

## 2015-04-03 DIAGNOSIS — J449 Chronic obstructive pulmonary disease, unspecified: Secondary | ICD-10-CM

## 2015-04-03 DIAGNOSIS — H6123 Impacted cerumen, bilateral: Secondary | ICD-10-CM

## 2015-04-03 DIAGNOSIS — Z7189 Other specified counseling: Secondary | ICD-10-CM

## 2015-04-03 DIAGNOSIS — E785 Hyperlipidemia, unspecified: Secondary | ICD-10-CM

## 2015-04-03 DIAGNOSIS — F17200 Nicotine dependence, unspecified, uncomplicated: Secondary | ICD-10-CM

## 2015-04-03 DIAGNOSIS — I700 Atherosclerosis of aorta: Secondary | ICD-10-CM

## 2015-04-03 DIAGNOSIS — N400 Enlarged prostate without lower urinary tract symptoms: Secondary | ICD-10-CM

## 2015-04-03 DIAGNOSIS — I2510 Atherosclerotic heart disease of native coronary artery without angina pectoris: Secondary | ICD-10-CM

## 2015-04-03 NOTE — Assessment & Plan Note (Signed)
Chronic, great control on current regimen.

## 2015-04-03 NOTE — Patient Instructions (Addendum)
We will refer you back for another lung cancer screening CT scan.  Consider shingles shot.  Try dilute hydrogen peroxide (equal parts 1:1 with water) and place a few drops every 2 weeks into ears (as long as tolerated well).  You ar e doing well today. Return as needed or in 1 year for next medicare wellness visit  Health Maintenance, Male A healthy lifestyle and preventative care can promote health and wellness.  Maintain regular health, dental, and eye exams.  Eat a healthy diet. Foods like vegetables, fruits, whole grains, low-fat dairy products, and lean protein foods contain the nutrients you need and are low in calories. Decrease your intake of foods high in solid fats, added sugars, and salt. Get information about a proper diet from your health care provider, if necessary.  Regular physical exercise is one of the most important things you can do for your health. Most adults should get at least 150 minutes of moderate-intensity exercise (any activity that increases your heart rate and causes you to sweat) each week. In addition, most adults need muscle-strengthening exercises on 2 or more days a week.   Maintain a healthy weight. The body mass index (BMI) is a screening tool to identify possible weight problems. It provides an estimate of body fat based on height and weight. Your health care provider can find your BMI and can help you achieve or maintain a healthy weight. For males 20 years and older:  A BMI below 18.5 is considered underweight.  A BMI of 18.5 to 24.9 is normal.  A BMI of 25 to 29.9 is considered overweight.  A BMI of 30 and above is considered obese.  Maintain normal blood lipids and cholesterol by exercising and minimizing your intake of saturated fat. Eat a balanced diet with plenty of fruits and vegetables. Blood tests for lipids and cholesterol should begin at age 60 and be repeated every 5 years. If your lipid or cholesterol levels are high, you are over age 29,  or you are at high risk for heart disease, you may need your cholesterol levels checked more frequently.Ongoing high lipid and cholesterol levels should be treated with medicines if diet and exercise are not working.  If you smoke, find out from your health care provider how to quit. If you do not use tobacco, do not start.  Lung cancer screening is recommended for adults aged 64-80 years who are at high risk for developing lung cancer because of a history of smoking. A yearly low-dose CT scan of the lungs is recommended for people who have at least a 30-pack-year history of smoking and are current smokers or have quit within the past 15 years. A pack year of smoking is smoking an average of 1 pack of cigarettes a day for 1 year (for example, a 30-pack-year history of smoking could mean smoking 1 pack a day for 30 years or 2 packs a day for 15 years). Yearly screening should continue until the smoker has stopped smoking for at least 15 years. Yearly screening should be stopped for people who develop a health problem that would prevent them from having lung cancer treatment.  If you choose to drink alcohol, do not have more than 2 drinks per day. One drink is considered to be 12 oz (360 mL) of beer, 5 oz (150 mL) of wine, or 1.5 oz (45 mL) of liquor.  Avoid the use of street drugs. Do not share needles with anyone. Ask for help if you need  support or instructions about stopping the use of drugs.  High blood pressure causes heart disease and increases the risk of stroke. High blood pressure is more likely to develop in:  People who have blood pressure in the end of the normal range (100-139/85-89 mm Hg).  People who are overweight or obese.  People who are African American.  If you are 26-48 years of age, have your blood pressure checked every 3-5 years. If you are 4 years of age or older, have your blood pressure checked every year. You should have your blood pressure measured twice--once when you  are at a hospital or clinic, and once when you are not at a hospital or clinic. Record the average of the two measurements. To check your blood pressure when you are not at a hospital or clinic, you can use:  An automated blood pressure machine at a pharmacy.  A home blood pressure monitor.  If you are 78-32 years old, ask your health care provider if you should take aspirin to prevent heart disease.  Diabetes screening involves taking a blood sample to check your fasting blood sugar level. This should be done once every 3 years after age 49 if you are at a normal weight and without risk factors for diabetes. Testing should be considered at a younger age or be carried out more frequently if you are overweight and have at least 1 risk factor for diabetes.  Colorectal cancer can be detected and often prevented. Most routine colorectal cancer screening begins at the age of 3 and continues through age 48. However, your health care provider may recommend screening at an earlier age if you have risk factors for colon cancer. On a yearly basis, your health care provider may provide home test kits to check for hidden blood in the stool. A small camera at the end of a tube may be used to directly examine the colon (sigmoidoscopy or colonoscopy) to detect the earliest forms of colorectal cancer. Talk to your health care provider about this at age 22 when routine screening begins. A direct exam of the colon should be repeated every 5-10 years through age 62, unless early forms of precancerous polyps or small growths are found.  People who are at an increased risk for hepatitis B should be screened for this virus. You are considered at high risk for hepatitis B if:  You were born in a country where hepatitis B occurs often. Talk with your health care provider about which countries are considered high risk.  Your parents were born in a high-risk country and you have not received a shot to protect against  hepatitis B (hepatitis B vaccine).  You have HIV or AIDS.  You use needles to inject street drugs.  You live with, or have sex with, someone who has hepatitis B.  You are a man who has sex with other men (MSM).  You get hemodialysis treatment.  You take certain medicines for conditions like cancer, organ transplantation, and autoimmune conditions.  Hepatitis C blood testing is recommended for all people born from [birth year] through 1967 and any individual with known risk factors for hepatitis C.  Healthy men should no longer receive prostate-specific antigen (PSA) blood tests as part of routine cancer screening. Talk to your health care provider about prostate cancer screening.  Testicular cancer screening is not recommended for adolescents or adult males who have no symptoms. Screening includes self-exam, a health care provider exam, and other screening tests. Consult with your  health care provider about any symptoms you have or any concerns you have about testicular cancer.  Practice safe sex. Use condoms and avoid high-risk sexual practices to reduce the spread of sexually transmitted infections (STIs).  You should be screened for STIs, including gonorrhea and chlamydia if:  You are sexually active and are younger than 24 years.  You are older than 24 years, and your health care provider tells you that you are at risk for this type of infection.  Your sexual activity has changed since you were last screened, and you are at an increased risk for chlamydia or gonorrhea. Ask your health care provider if you are at risk.  If you are at risk of being infected with HIV, it is recommended that you take a prescription medicine daily to prevent HIV infection. This is called pre-exposure prophylaxis (PrEP). You are considered at risk if:  You are a man who has sex with other men (MSM).  You are a heterosexual man who is sexually active with multiple partners.  You take drugs by  injection.  You are sexually active with a partner who has HIV.  Talk with your health care provider about whether you are at high risk of being infected with HIV. If you choose to begin PrEP, you should first be tested for HIV. You should then be tested every 3 months for as long as you are taking PrEP.  Use sunscreen. Apply sunscreen liberally and repeatedly throughout the day. You should seek shade when your shadow is shorter than you. Protect yourself by wearing long sleeves, pants, a wide-brimmed hat, and sunglasses year round whenever you are outdoors.  Tell your health care provider of new moles or changes in moles, especially if there is a change in shape or color. Also, tell your health care provider if a mole is larger than the size of a pencil eraser.  A one-time screening for abdominal aortic aneurysm (AAA) and surgical repair of large AAAs by ultrasound is recommended for men aged 40-75 years who are current or former smokers.  Stay current with your vaccines (immunizations).   This information is not intended to replace advice given to you by your health care provider. Make sure you discuss any questions you have with your health care provider.   Document Released: 08/06/2007 Document Revised: 02/28/2014 Document Reviewed: 07/05/2010 Elsevier Interactive Patient Education [employer].

## 2015-04-03 NOTE — Assessment & Plan Note (Signed)
By CT, pt denies dsypnea

## 2015-04-03 NOTE — Assessment & Plan Note (Signed)

## 2015-04-03 NOTE — Assessment & Plan Note (Signed)
Discussed home remedy

## 2015-04-03 NOTE — Assessment & Plan Note (Signed)
Stable on proscar. Followed by uro. Appreciate their care of patient.

## 2015-04-03 NOTE — Assessment & Plan Note (Signed)
Advanced directives: has living will at home. HCPOA would be wife then oldest son. Copy in chart. Did not receive HCPOA form.

## 2015-04-03 NOTE — Progress Notes (Signed)
Pre visit review using our clinic review tool, if applicable. No additional management support is needed unless otherwise documented below in the visit note. 

## 2015-04-03 NOTE — Assessment & Plan Note (Signed)
Continue to encourage cessation. Contemplative.

## 2015-04-03 NOTE — Assessment & Plan Note (Signed)
By CT scan.

## 2015-04-03 NOTE — Assessment & Plan Note (Addendum)
3v CAD by CT. Continue lipitor.

## 2015-04-03 NOTE — Progress Notes (Signed)
BP 128/60 mmHg  Pulse 59  Temp(Src) 98.3 F (36.8 C) (Tympanic)  Ht 6' (1.829 m)  Wt 179 lb (81.194 kg)  BMI 24.27 kg/m2  SpO2 96%   CC: medicare wellness visit  Subjective:    Patient ID: Richard Powell, male    DOB: 02/04/1946, 70 y.o.   MRN: [id number]  HPI: Richard Powell is a 70 y.o. male presenting on 04/03/2015 for Annual Exam   Continues smoking regularly <1 ppd.   Failed hearing screen today. Denies trouble with this. Endorses chronic L ear canal cerumen impaction Vision screen with eye doctor yearly No falls in last year.  Denies anhedonia, depression, sadness   Preventative: Colonoscopy 01/2010 - 1 polyp, hyperplastic, severe diverticulosis , rec rpt 5 yrs given fmhx. Sharlett Iles). Received letter to reschedule. Prostate cancer screening - h/o elevated PSA in past. No fmhx prostate cancer. ++BPH sxs. Follows with Dr. Junious Silk yearly in August.  Lung cancer screening - s/p LRCT scan of lungs last year showing COPD changes but no nodules or other evidence of cancer.  Flu shot - yearly Td 2007 Pneumovax - 2013. prevnar 2016 zostavax - will receive at pharmacy  Advanced directives: has living will at home. HCPOA would be wife then oldest son. Copy in chart. Seat belt use discussed Sunscreen use discussed. No changing moles on skin.   Married, lives with wife Children 3- out of home Occupation: Engineer, civil (consulting) at [employer], retired June 2015 Activity: stays active at home, walking more, golfing Diet: some water, fruits/vegetables daily  Relevant past medical, surgical, family and social history reviewed and updated as indicated. Interim medical history since our last visit reviewed. Allergies and medications reviewed and updated. Current Outpatient Prescriptions on File Prior to Visit  Medication Sig  . aspirin 81 MG tablet Take 81 mg by mouth 3 (three) times a week.  Marland Kitchen atorvastatin (LIPITOR) 20 MG tablet TAKE 1 TABLET DAILY  . finasteride (PROSCAR) 5 MG tablet  Take 5 mg by mouth daily.  . Multiple Vitamins-Minerals (MULTIVITAMIN PO) Take 1 tablet by mouth daily.   No current facility-administered medications on file prior to visit.    Review of Systems Per HPI unless specifically indicated in ROS section     Objective:    BP 128/60 mmHg  Pulse 59  Temp(Src) 98.3 F (36.8 C) (Tympanic)  Ht 6' (1.829 m)  Wt 179 lb (81.194 kg)  BMI 24.27 kg/m2  SpO2 96%  Wt Readings from Last 3 Encounters:  04/03/15 179 lb (81.194 kg)  03/31/14 184 lb (83.462 kg)  03/29/13 184 lb 12 oz (83.802 kg)    Physical Exam  Constitutional: He is oriented to person, place, and time. He appears well-developed and well-nourished. No distress.  HENT:  Head: Normocephalic and atraumatic.  Right Ear: Hearing, tympanic membrane, external ear and ear canal normal.  Left Ear: Hearing, tympanic membrane, external ear and ear canal normal.  Nose: Nose normal.  Mouth/Throat: Uvula is midline, oropharynx is clear and moist and mucous membranes are normal. No oropharyngeal exudate, posterior oropharyngeal edema or posterior oropharyngeal erythema.  Eyes: Conjunctivae and EOM are normal. Pupils are equal, round, and reactive to light. No scleral icterus.  Neck: Normal range of motion. Neck supple. Carotid bruit is not present. No thyromegaly present.  Cardiovascular: Normal rate, regular rhythm, normal heart sounds and intact distal pulses.   No murmur heard. Pulses:      Radial pulses are 2+ on the right side, and 2+ on the left  side.  Pulmonary/Chest: Effort normal and breath sounds normal. No respiratory distress. He has no wheezes. He has no rales.  Abdominal: Soft. Bowel sounds are normal. He exhibits no distension and no mass. There is no tenderness. There is no rebound and no guarding.  Musculoskeletal: Normal range of motion. He exhibits no edema.  Lymphadenopathy:    He has no cervical adenopathy.  Neurological: He is alert and oriented to person, place, and  time.  CN grossly intact, station and gait intact Recall 2/3, 2/3 with cue Calculation 4/5 serial 7s  Skin: Skin is warm and dry. No rash noted.  Psychiatric: He has a normal mood and affect. His behavior is normal. Judgment and thought content normal.  Nursing note and vitals reviewed.  Results for orders placed or performed in visit on 04/01/15  Lipid panel  Result Value Ref Range   Cholesterol 131 0 - 200 mg/dL   Triglycerides 81.0 0.0 - 149.0 mg/dL   HDL 46.30 >39.00 mg/dL   VLDL 16.2 0.0 - 40.0 mg/dL   LDL Cholesterol 68 0 - 99 mg/dL   Total CHOL/HDL Ratio 3    NonHDL 84.45   PSA  Result Value Ref Range   PSA 2.54 0.10 - 4.00 ng/mL  Basic metabolic panel  Result Value Ref Range   Sodium 138 135 - 145 mEq/L   Potassium 4.8 3.5 - 5.1 mEq/L   Chloride 104 96 - 112 mEq/L   CO2 28 19 - 32 mEq/L   Glucose, Bld 101 (H) 70 - 99 mg/dL   BUN 18 6 - 23 mg/dL   Creatinine, Ser 1.02 0.40 - 1.50 mg/dL   Calcium 9.8 8.4 - 10.5 mg/dL   GFR 76.28 >60.00 mL/min   CT CHEST SCREENING WITHOUT CONTRAST IMPRESSION: 1. Lung-RADS Category 2, benign appearance or behavior. Continue annual screening with low-dose chest CT without contrast in 12 months. 2. Mild diffuse bronchial wall thickening with mild centrilobular and paraseptal emphysema; imaging findings suggestive of underlying COPD. 3. Atherosclerosis, including three-vessel coronary artery disease. Assessment for potential risk factor modification, dietary therapy or pharmacologic therapy may be warranted, if clinically indicated. 36. Sequela of old granulomatous disease, as above. These results will be called to the ordering clinician or representative by the Radiologist Assistant, and communication documented in the PACS Dashboard. Electronically Signed  By: Vinnie Langton M.D.  On: 04/04/2013 16:27    Assessment & Plan:   Problem List Items Addressed This Visit    Thoracic aorta atherosclerosis ([place])    By CT scan.       Smoker    Continue to encourage cessation. Contemplative.      Relevant Orders   CT CHEST LUNG CA SCREEN LOW DOSE W/O CM   Medicare annual wellness visit, subsequent - Primary    I have personally reviewed the Medicare Annual Wellness questionnaire and have noted 1. The patient's medical and social history 2. Their use of alcohol, tobacco or illicit drugs 3. Their current medications and supplements 4. The patient's functional ability including ADL's, fall risks, home safety risks and hearing or visual impairment. Cognitive function has been assessed and addressed as indicated.  5. Diet and physical activity 6. Evidence for depression or mood disorders The patients weight, height, BMI have been recorded in the chart. I have made referrals, counseling and provided education to the patient based on review of the above and I have provided the pt with a written personalized care plan for preventive services. Provider list updated.. See scanned  questionairre as needed for further documentation. Reviewed preventative protocols and updated unless pt declined.       HLD (hyperlipidemia)    Chronic, great control on current regimen.       COPD (chronic obstructive pulmonary disease) (HCC)    By CT, pt denies dsypnea      Cerumen impaction    Discussed home remedy      CAD (coronary artery disease), native coronary artery    3v CAD by CT. Continue lipitor.      BPH (benign prostatic hypertrophy)    Stable on proscar. Followed by uro. Appreciate their care of patient.      Advanced care planning/counseling discussion    Advanced directives: has living will at home. HCPOA would be wife then oldest son. Copy in chart. Did not receive HCPOA form.          Follow up plan: Return in about 1 year (around 04/02/2016), or as needed, for medicare wellness visit.

## 2015-04-15 ENCOUNTER — Ambulatory Visit (AMBULATORY_SURGERY_CENTER): Payer: Self-pay | Admitting: *Deleted

## 2015-04-15 VITALS — Ht 71.5 in | Wt 179.0 lb

## 2015-04-15 DIAGNOSIS — Z800 Family history of malignant neoplasm of digestive organs: Secondary | ICD-10-CM

## 2015-04-15 MED ORDER — NA SULFATE-K SULFATE-MG SULF 17.5-3.13-1.6 GM/177ML PO SOLN: ORAL | Status: DC

## 2015-04-15 NOTE — Progress Notes (Signed)
No allergies to eggs or soy. No problems with anesthesia.  Pt given Emmi instructions for colonoscopy  No oxygen use  No diet drug use  

## 2015-04-22 HISTORY — PX: COLONOSCOPY: SHX174

## 2015-04-29 ENCOUNTER — Ambulatory Visit (AMBULATORY_SURGERY_CENTER): Payer: Medicare Other | Admitting: Internal Medicine

## 2015-04-29 ENCOUNTER — Encounter: Payer: Self-pay | Admitting: Internal Medicine

## 2015-04-29 VITALS — BP 147/64 | HR 58 | Temp 97.8°F | Resp 14 | Ht 71.0 in | Wt 179.0 lb

## 2015-04-29 DIAGNOSIS — K635 Polyp of colon: Secondary | ICD-10-CM

## 2015-04-29 DIAGNOSIS — J449 Chronic obstructive pulmonary disease, unspecified: Secondary | ICD-10-CM | POA: Diagnosis not present

## 2015-04-29 DIAGNOSIS — D127 Benign neoplasm of rectosigmoid junction: Secondary | ICD-10-CM

## 2015-04-29 DIAGNOSIS — D124 Benign neoplasm of descending colon: Secondary | ICD-10-CM

## 2015-04-29 DIAGNOSIS — Z1211 Encounter for screening for malignant neoplasm of colon: Secondary | ICD-10-CM

## 2015-04-29 DIAGNOSIS — Z800 Family history of malignant neoplasm of digestive organs: Secondary | ICD-10-CM

## 2015-04-29 DIAGNOSIS — N400 Enlarged prostate without lower urinary tract symptoms: Secondary | ICD-10-CM | POA: Diagnosis not present

## 2015-04-29 DIAGNOSIS — G4733 Obstructive sleep apnea (adult) (pediatric): Secondary | ICD-10-CM | POA: Diagnosis not present

## 2015-04-29 MED ORDER — SODIUM CHLORIDE 0.9 % IV SOLN: 500.0000 mL | INTRAVENOUS | Status: DC

## 2015-04-29 NOTE — Patient Instructions (Addendum)
Impressions/recommendations:  Polyps (handout given) Diverticulosis (handout given) High Fiber Diet (handout given) Hemorrhoids (handout given)  YOU HAD AN ENDOSCOPIC PROCEDURE TODAY AT [facility]:   Refer to the procedure report that was given to you for any specific questions about what was found during the examination.  If the procedure report does not answer your questions, please call your gastroenterologist to clarify.  If you requested that your care partner not be given the details of your procedure findings, then the procedure report has been included in a sealed envelope for you to review at your convenience later.  YOU SHOULD EXPECT: Some feelings of bloating in the abdomen. Passage of more gas than usual.  Walking can help get rid of the air that was put into your GI tract during the procedure and reduce the bloating. If you had a lower endoscopy (such as a colonoscopy or flexible sigmoidoscopy) you may notice spotting of blood in your stool or on the toilet paper. If you underwent a bowel prep for your procedure, you may not have a normal bowel movement for a few days.  Please Note:  You might notice some irritation and congestion in your nose or some drainage.  This is from the oxygen used during your procedure.  There is no need for concern and it should clear up in a day or so.  SYMPTOMS TO REPORT IMMEDIATELY:   Following lower endoscopy (colonoscopy or flexible sigmoidoscopy):  Excessive amounts of blood in the stool  Significant tenderness or worsening of abdominal pains  Swelling of the abdomen that is new, acute  Fever of 100F or higher   For urgent or emergent issues, a gastroenterologist can be reached at any hour by calling [phone].   DIET: Your first meal following the procedure should be a small meal and then it is ok to progress to your normal diet. Heavy or fried foods are harder to digest and may make you feel nauseous or bloated.   Likewise, meals heavy in dairy and vegetables can increase bloating.  Drink plenty of fluids but you should avoid alcoholic beverages for 24 hours.  ACTIVITY:  You should plan to take it easy for the rest of today and you should NOT DRIVE or use heavy machinery until tomorrow (because of the sedation medicines used during the test).    FOLLOW UP: Our staff will call the number listed on your records the next business day following your procedure to check on you and address any questions or concerns that you may have regarding the information given to you following your procedure. If we do not reach you, we will leave a message.  However, if you are feeling well and you are not experiencing any problems, there is no need to return our call.  We will assume that you have returned to your regular daily activities without incident.  If any biopsies were taken you will be contacted by phone or by letter within the next 1-3 weeks.  Please call us at [phone] if you have not heard about the biopsies in 3 weeks.    SIGNATURES/CONFIDENTIALITY: You and/or your care partner have signed paperwork which will be entered into your electronic medical record.  These signatures attest to the fact that that the information above on your After Visit Summary has been reviewed and is understood.  Full responsibility of the confidentiality of this discharge information lies with you and/or your care-partner.  Polyps, diverticulosis, high fiber diet, hemorrhoid information  given.

## 2015-04-29 NOTE — Progress Notes (Signed)
Called to room to assist during endoscopic procedure.  Patient ID and intended procedure confirmed with present staff. Received instructions for my participation in the procedure from the performing physician.  

## 2015-04-29 NOTE — Progress Notes (Signed)
Report to PACU, RN, vss, BBS= Clear.  

## 2015-04-29 NOTE — Op Note (Signed)
Patient Name: Richard Powell Procedure Date: 04/29/2015 11:26 AM MRN: [id number] Endoscopist: Jerene Bears , MD Age: 70 Referring MD:  Date of Birth: 1945-11-11 Gender: Male Procedure:            Colonoscopy Indications:          Screening patient at increased risk: Family history of                        1st-degree relative with colorectal cancer at age 37                        years (or older), Last colonoscopy: 2011 Medicines:            Monitored Anesthesia Care Procedure:            Pre-Anesthesia Assessment:                       - Prior to the procedure, a History and Physical was                        performed, and patient medications and allergies were                        reviewed. The patient's tolerance of previous                        anesthesia was also reviewed. The risks and benefits of                        the procedure and the sedation options and risks were                        discussed with the patient. All questions were                        answered, and informed consent was obtained. Prior                        Anticoagulants: The patient has taken no previous                        anticoagulant or antiplatelet agents. ASA Grade                        Assessment: II - A patient with mild systemic disease.                        After reviewing the risks and benefits, the patient was                        deemed in satisfactory condition to undergo the                        procedure.                       After obtaining informed consent, the colonoscope was                        passed under direct vision. Throughout the procedure,  the patient's blood pressure, pulse, and oxygen                        saturations were monitored continuously. The Model                        [id number]-[id number] scope was introduced through the                        anus and advanced to the the cecum, identified by   appendiceal orifice and ileocecal valve. The                        colonoscopy was performed without difficulty. The                        patient tolerated the procedure well. The quality of                        the bowel preparation was good. The ileocecal valve,                        appendiceal orifice, and rectum were photographed. Scope In: 11:36:14 AM Scope Out: 11:51:58 AM Scope Withdrawal Time: 0 hours 10 minutes 30 seconds  Total Procedure Duration: 0 hours 15 minutes 44 seconds  Findings:      The digital rectal exam was normal.      Two sessile polyps were found in the proximal descending colon. The       polyps were 3 to 5 mm in size. These polyps were removed with a cold       snare. Resection and retrieval were complete.      A 6 mm polyp was found in the recto-sigmoid colon. The polyp was       sessile. The polyp was removed with a cold snare. Resection and       retrieval were complete.      Many small and large-mouthed diverticula were found in the left colon.      Internal hemorrhoids were found during retroflexion. The hemorrhoids       were small. Complications:        No immediate complications. Estimated Blood Loss: Estimated blood loss was minimal. Impression:           - Two 3 to 5 mm polyps in the proximal descending                        colon, removed with a cold snare. Resected and                        retrieved.                       - One 6 mm polyp at the recto-sigmoid colon, removed                        with a cold snare. Resected and retrieved.                       - Severe diverticulosis in the left colon.                       -  Internal hemorrhoids. Recommendation:       - Patient has a contact number available for                        emergencies. The signs and symptoms of potential                        delayed complications were discussed with the patient.                        Return to normal activities tomorrow. Written  discharge                        instructions were provided to the patient.                       - Resume previous diet.                       - Continue present medications.                       - Repeat colonoscopy is recommended for surveillance.                        The colonoscopy date will be determined after pathology                        results from today's exam become available for review. Procedure Code(s):    --- Professional ---                       [phone], Colonoscopy, flexible; with removal of tumor(s),                        polyp(s), or other lesion(s) by snare technique CPT copyright 2016 [employer]. All rights reserved. [name]. Hilarie Fredrickson, MD Jerene Bears, MD 04/29/2015 11:58:12 AM This report has been signed electronically. Number of Addenda: 0

## 2015-04-30 ENCOUNTER — Telehealth: Payer: Self-pay | Admitting: *Deleted

## 2015-04-30 NOTE — Telephone Encounter (Signed)
No answer, message left for the patient. 

## 2015-05-02 ENCOUNTER — Encounter: Payer: Self-pay | Admitting: Family Medicine

## 2015-05-04 ENCOUNTER — Encounter: Payer: Self-pay | Admitting: Internal Medicine

## 2015-05-05 ENCOUNTER — Encounter: Payer: Self-pay | Admitting: Family Medicine

## 2015-05-11 ENCOUNTER — Other Ambulatory Visit: Payer: Self-pay | Admitting: Acute Care

## 2015-05-11 DIAGNOSIS — F17210 Nicotine dependence, cigarettes, uncomplicated: Principal | ICD-10-CM

## 2015-05-22 ENCOUNTER — Ambulatory Visit (INDEPENDENT_AMBULATORY_CARE_PROVIDER_SITE_OTHER): Payer: Medicare Other | Admitting: Acute Care

## 2015-05-22 ENCOUNTER — Ambulatory Visit (INDEPENDENT_AMBULATORY_CARE_PROVIDER_SITE_OTHER)
Admission: RE | Admit: 2015-05-22 | Discharge: 2015-05-22 | Disposition: A | Discharge status: Home / Self Care | Payer: Medicare Other | Source: Ambulatory Visit | Attending: Acute Care | Admitting: Acute Care

## 2015-05-22 DIAGNOSIS — F17210 Nicotine dependence, cigarettes, uncomplicated: Secondary | ICD-10-CM

## 2015-05-22 DIAGNOSIS — Z87891 Personal history of nicotine dependence: Secondary | ICD-10-CM | POA: Diagnosis not present

## 2015-05-22 NOTE — Progress Notes (Signed)
Shared Decision Making Visit Lung Cancer Screening Program [phone])   Eligibility:  Age 70 y.o.  Pack Years Smoking History Calculation 54-pack-year smoking history (# packs/per year x # years smoked)  Recent History of coughing up blood  No  Unexplained weight loss?No ( >Than 15 pounds within the last 6 months )  Prior History Lung / other cancer No (Diagnosis within the last 5 years already requiring surveillance chest CT Scans).  Smoking Status: Current Smoker  Former Smokers: NA  Quit Date:NA  Visit Components:  Discussion included one or more decision making aids.Yes  Discussion included risk/benefits of screening.Yes  Discussion included potential follow up diagnostic testing for abnormal scans. Yes  Discussion included meaning and risk of over diagnosis.Yes  Discussion included meaning and risk of False Positives. Yes  Discussion included meaning of total radiation exposure. Yes  Counseling Included:  Importance of adherence to annual lung cancer LDCT screening. Yes  Impact of comorbidities on ability to participate in the program. Yes  Ability and willingness to under diagnostic treatment. Yes  Smoking Cessation Counseling:  Current Smokers:   Discussed importance of smoking cessation.Yes  Information about tobacco cessation classes and interventions provided to patient. Yes  Patient provided with "ticket" for LDCT Scan. Yes  Symptomatic Patient. No  Counseling:NA  Diagnosis Code: Tobacco Use Z72.0  AsymptomaticYes  Counseling Yes  Former Smokers:   Discussed the importance of maintaining cigarette abstinence. NA, Current smoker  Diagnosis Code: Personal History of Nicotine Dependence. R15.400  Information about tobacco cessation classes and interventions provided to patient. NA  Patient provided with "ticket" for LDCT Scan. Yes  Written Order for Lung Cancer Screening with LDCT placed in Epic.Yes (CT Chest Lung Cancer Screening Low  Dose W/O CM) [id number] Z12.2-Screening of respiratory organs Z87.891-Personal history of nicotine dependence  I have spent 20 minutes of face to face time with Richard Powell discussing the risks and benefits of lung cancer screening. We viewed a power point together that explained in detail the above noted topics. We paused at intervals to allow for questions to be asked and answered to ensure understanding.We discussed that the single most powerful action that he can take to decrease his risk of developing lung cancer is to quit smoking. We discussed whether or not he  is ready to commit to setting a quit date.He is currently not ready to set a quit date. We discussed options for tools to aid in quitting smoking including nicotine replacement therapy, non-nicotine medications, support groups, Quit Smart classes, and behavior modification. We discussed that often times setting smaller, more achievable goals, such as eliminating 1 cigarette a day for a week and then 2 cigarettes a day for a week can be helpful in slowly decreasing the number of cigarettes smoked. This allows for a sense of accomplishment as well as providing a clinical benefit. I gave Richard Powell  the " Be Stronger Than Your Excuses" card with contact information for community resources, classes, free nicotine replacement therapy, and access to mobile apps, text messaging, and on-line smoking cessation help. I have also given him my card and contact information in the event he  needs to contact me. We discussed the time and location of the scan, and that either June Leap, CMA, or I will call with the results within 24-48 hours of receiving them. I have provided Richard Powell with a copy of the power point we viewed  as a resource in the event they need reinforcement of the concepts we discussed  today in the office. The patient verbalized understanding of all of  the above and had no further questions upon leaving the office. They have my contact  information in the event they have any further questions.   Magdalen Spatz, NP  05/22/15

## 2015-05-23 DIAGNOSIS — I2510 Atherosclerotic heart disease of native coronary artery without angina pectoris: Secondary | ICD-10-CM

## 2015-05-23 HISTORY — DX: Atherosclerotic heart disease of native coronary artery without angina pectoris: I25.10

## 2015-05-24 ENCOUNTER — Encounter: Payer: Self-pay | Admitting: Acute Care

## 2015-05-25 ENCOUNTER — Telehealth: Payer: Self-pay | Admitting: Acute Care

## 2015-05-25 ENCOUNTER — Other Ambulatory Visit: Payer: Self-pay | Admitting: Acute Care

## 2015-05-25 DIAGNOSIS — F17210 Nicotine dependence, cigarettes, uncomplicated: Principal | ICD-10-CM

## 2015-05-25 NOTE — Telephone Encounter (Signed)
Attempted to call screening results. No answer. Left contact information and requested that patient call the office for results.

## 2015-05-26 ENCOUNTER — Encounter: Payer: Self-pay | Admitting: Family Medicine

## 2015-10-23 DIAGNOSIS — R351 Nocturia: Secondary | ICD-10-CM | POA: Diagnosis not present

## 2015-10-23 DIAGNOSIS — N5201 Erectile dysfunction due to arterial insufficiency: Secondary | ICD-10-CM | POA: Diagnosis not present

## 2015-10-23 DIAGNOSIS — R972 Elevated prostate specific antigen [PSA]: Secondary | ICD-10-CM | POA: Diagnosis not present

## 2015-10-23 DIAGNOSIS — N401 Enlarged prostate with lower urinary tract symptoms: Secondary | ICD-10-CM | POA: Diagnosis not present

## 2015-10-31 ENCOUNTER — Encounter: Payer: Self-pay | Admitting: Family Medicine

## 2015-11-24 ENCOUNTER — Other Ambulatory Visit: Payer: Self-pay | Admitting: Family Medicine

## 2016-02-23 ENCOUNTER — Other Ambulatory Visit: Payer: Self-pay | Admitting: Family Medicine

## 2016-04-04 ENCOUNTER — Other Ambulatory Visit: Payer: Self-pay | Admitting: Family Medicine

## 2016-04-04 DIAGNOSIS — R972 Elevated prostate specific antigen [PSA]: Secondary | ICD-10-CM

## 2016-04-04 DIAGNOSIS — E785 Hyperlipidemia, unspecified: Secondary | ICD-10-CM

## 2016-04-06 ENCOUNTER — Other Ambulatory Visit: Payer: Medicare Other

## 2016-04-06 ENCOUNTER — Ambulatory Visit (INDEPENDENT_AMBULATORY_CARE_PROVIDER_SITE_OTHER): Payer: Medicare Other

## 2016-04-06 VITALS — BP 120/68 | HR 72 | Temp 97.9°F | Ht 70.75 in | Wt 176.5 lb

## 2016-04-06 DIAGNOSIS — E785 Hyperlipidemia, unspecified: Secondary | ICD-10-CM | POA: Diagnosis not present

## 2016-04-06 DIAGNOSIS — Z125 Encounter for screening for malignant neoplasm of prostate: Secondary | ICD-10-CM | POA: Diagnosis not present

## 2016-04-06 DIAGNOSIS — Z0000 Encounter for general adult medical examination without abnormal findings: Secondary | ICD-10-CM | POA: Diagnosis not present

## 2016-04-06 LAB — LIPID PANEL
CHOL/HDL RATIO: 3
Cholesterol: 140 mg/dL (ref 0–200)
HDL: 47 mg/dL (ref >39.00)
LDL CALC: 61 mg/dL (ref 0–99)
NonHDL: 93.27
TRIGLYCERIDES: 161 mg/dL — AB (ref 0.0–149.0)
VLDL: 32.2 mg/dL (ref 0.0–40.0)

## 2016-04-06 LAB — COMPREHENSIVE METABOLIC PANEL
ALT: 18 U/L (ref 0–53)
AST: 19 U/L (ref 0–37)
Albumin: 4.4 g/dL (ref 3.5–5.2)
Alkaline Phosphatase: 84 U/L (ref 39–117)
BUN: 16 mg/dL (ref 6–23)
CHLORIDE: 107 meq/L (ref 96–112)
CO2: 31 meq/L (ref 19–32)
Calcium: 10.1 mg/dL (ref 8.4–10.5)
Creatinine, Ser: 0.95 mg/dL (ref 0.40–1.50)
GFR: 82.57 mL/min (ref >60.00)
GLUCOSE: 94 mg/dL (ref 70–99)
POTASSIUM: 4.8 meq/L (ref 3.5–5.1)
Sodium: 139 mEq/L (ref 135–145)
Total Bilirubin: 0.8 mg/dL (ref 0.2–1.2)
Total Protein: 6.8 g/dL (ref 6.0–8.3)

## 2016-04-06 LAB — PSA, MEDICARE: PSA: 2.93 ng/ml (ref 0.10–4.00)

## 2016-04-06 NOTE — Progress Notes (Signed)
I reviewed health advisor's note, was available for consultation, and agree with documentation and plan.  

## 2016-04-06 NOTE — Progress Notes (Signed)
Pre visit review using our clinic review tool, if applicable. No additional management support is needed unless otherwise documented below in the visit note. 

## 2016-04-06 NOTE — Progress Notes (Signed)
PCP notes:   Health maintenance:  Tetanus - postponed/insurance Shingles - pt declined  Abnormal screenings:   Hearing - failed  Patient concerns:   Pt has ongoing concern with cerumen buildup in left ear.   Nurse concerns:  None  Next PCP appt:   04/08/16 @ 1500

## 2016-04-06 NOTE — Progress Notes (Signed)
Subjective:   Richard Powell is a 71 y.o. male who presents for Medicare Annual/Subsequent preventive examination.  Review of Systems:  N/A Cardiac Risk Factors include: advanced age (>14mn, >>54women);male gender;smoking/ tobacco exposure;dyslipidemia     Objective:    Vitals: BP 120/68 (BP Location: Right Arm, Patient Position: Sitting, Cuff Size: Normal)   Pulse 72   Temp 97.9 F (36.6 C) (Oral)   Ht 5' 10.75" (1.797 m) Comment: no shoes  Wt 176 lb 8 oz (80.1 kg)   SpO2 98%   BMI 24.79 kg/m   Body mass index is 24.79 kg/m.  Tobacco History  Smoking Status  . Current Every Day Smoker  . Packs/day: 1.00  . Years: 54.00  . Types: Cigarettes  Smokeless Tobacco  . Never Used     Ready to quit: No Counseling given: No   Past Medical History:  Diagnosis Date  . BPH (benign prostatic hypertrophy)    finasteride ([place])  . CAD (coronary artery disease) 05/2015   3v CAD by CT scan  . Cataract   . COPD (chronic obstructive pulmonary disease) ([facility]    by screening CT scan  . ED (erectile dysfunction) of organic origin   . Elevated PSA    benign prostate [place]  . Hyperlipemia    mild  . Sleep apnea    has cpap; uses occasionally.  uses nose strips   Past Surgical History:  Procedure Laterality Date  . COLONOSCOPY  01/2010   severe diverticulosis, 1 hyperplastic polyp, rpt 5 yrs ([facility]  . COLONOSCOPY  04/2015   hyperplastic polyps, severe diverticulosis, int hem, rpt 10 yrs ([facility])  . HAND SURGERY Left 1960  . VASECTOMY  1973   Family History  Problem Relation Age of Onset  . Colon cancer Mother 894 . Cancer Maternal Grandmother     ? abd cancer  . CAD Neg Hx   . Stroke Neg Hx   . Diabetes Neg Hx    History  Sexual Activity  . Sexual activity: Not on file    Outpatient Encounter Prescriptions as of 04/06/2016  Medication Sig  . aspirin 81 MG tablet Take 81 mg by mouth 3 (three) times a week.  . Aspirin-Caffeine (BAYER  BACK & BODY) 500-32.5 MG TABS Take 1 tablet by mouth as needed. Reported on 04/29/2015  . atorvastatin (LIPITOR) 20 MG tablet TAKE 1 TABLET DAILY  . finasteride (PROSCAR) 5 MG tablet Take 5 mg by mouth daily.  . Multiple Vitamins-Minerals (MULTIVITAMIN PO) Take 1 tablet by mouth daily.   No facility-administered encounter medications on file as of 04/06/2016.     Activities of Daily Living In your present state of health, do you have any difficulty performing the following activities: 04/06/2016  Hearing? N  Vision? Y  Difficulty concentrating or making decisions? N  Walking or climbing stairs? N  Dressing or bathing? N  Doing errands, shopping? N  Preparing Food and eating ? N  Using the Toilet? N  In the past six months, have you accidently leaked urine? N  Do you have problems with loss of bowel control? N  Managing your Medications? N  Managing your Finances? N  Housekeeping or managing your Housekeeping? N  Some recent data might be hidden    Patient Care Team: JRia Bush MD as PCP - General (Family Medicine) MFestus Aloe MD as Consulting Physician (Urology)   Assessment:     Hearing Screening   '125Hz'$  '250Hz'$  '500Hz'$  '1000Hz'$  '2000Hz'$   $'3000Hz'X$  '4000Hz'$  '6000Hz'$  '8000Hz'$   Right ear:   0 0 40  0    Left ear:   0 0 40  0    Vision Screening Comments: Last vision exam in Feb 2017 @ [facility]   Exercise Activities and Dietary recommendations Current Exercise Habits: Home exercise routine, Type of exercise: Other - see comments;walking (golf, yard work), Time (Minutes): 60, Frequency (Times/Week): 4, Weekly Exercise (Minutes/Week): 240, Intensity: Moderate, Exercise limited by: None identified  Goals    . Increase physical activity          Starting 04/06/16, I will continue to exercise or do yard work at least 60 min 3-5 days per week.       Fall Risk Fall Risk  04/06/2016 04/03/2015 03/31/2014 03/29/2013  Falls in the past year? No No No No   Depression Screen PHQ 2/9  Scores 04/06/2016 04/03/2015 03/31/2014 03/29/2013  PHQ - 2 Score 0 0 0 0    Cognitive Function MMSE - Mini Mental State Exam 04/06/2016  Orientation to time 5  Orientation to Place 5  Registration 3  Attention/ Calculation 0  Recall 3  Language- name 2 objects 0  Language- repeat 1  Language- follow 3 step command 3  Language- read & follow direction 0  Write a sentence 0  Copy design 0  Total score 20     PLEASE NOTE: A Mini-Cog screen was completed. Maximum score is 20. A value of 0 denotes this part of Folstein MMSE was not completed or the patient failed this part of the Mini-Cog screening.   Mini-Cog Screening Orientation to Time - Max 5 pts Orientation to Place - Max 5 pts Registration - Max 3 pts Recall - Max 3 pts Language Repeat - Max 1 pts Language Follow 3 Step Command - Max 3 pts     Immunization History  Administered Date(s) Administered  . Influenza, High Dose Seasonal PF 12/11/2014  . Influenza,inj,Quad PF,36+ Mos 12/15/2015  . Influenza-Unspecified 11/21/2012, 12/22/2013  . Pneumococcal Conjugate-13 03/31/2014  . Pneumococcal Polysaccharide-23 12/23/2011  . Td 06/21/2005   Screening Tests Health Maintenance  Topic Date Due  . ZOSTAVAX  04/06/2025 (Originally 02/26/2003)  . DTaP/Tdap/Td (1 - Tdap) 06/20/2025 (Originally 06/22/2005)  . TETANUS/TDAP  06/20/2025 (Originally 06/22/2015)  . COLONOSCOPY  04/28/2025  . INFLUENZA VACCINE  Addressed  . Hepatitis C Screening  Completed  . PNA vac Low Risk Adult  Completed      Plan:     I have personally reviewed and addressed the Medicare Annual Wellness questionnaire and have noted the following in the patient's chart:  A. Medical and social history B. Use of alcohol, tobacco or illicit drugs  C. Current medications and supplements D. Functional ability and status E.  Nutritional status F.  Physical activity G. Advance directives H. List of other physicians I.  Hospitalizations, surgeries, and ER visits in  previous 12 months J.  [place] to include hearing, vision, cognitive, depression L. Referrals and appointments - none  In addition, I have reviewed and discussed with patient certain preventive protocols, quality metrics, and best practice recommendations. A written personalized care plan for preventive services as well as general preventive health recommendations were provided to patient.  See attached scanned questionnaire for additional information.   Signed,   Lindell Noe, MHA, BS, LPN Health Coach

## 2016-04-06 NOTE — Patient Instructions (Signed)
[name] , Thank you for taking time to come for your Medicare Wellness Visit. I appreciate your ongoing commitment to your health goals. Please review the following plan we discussed and let me know if I can assist you in the future.   These are the goals we discussed: Goals    . Increase physical activity          Starting 04/06/16, I will continue to exercise or do yard work at least 60 min 3-5 days per week.        This is a list of the screening recommended for you and due dates:  Health Maintenance  Topic Date Due  . Shingles Vaccine  04/06/2025*  . DTaP/Tdap/Td vaccine (1 - Tdap) 06/20/2025*  . Tetanus Vaccine  06/20/2025*  . Colon Cancer Screening  04/28/2025  . Flu Shot  Addressed  .  Hepatitis C: One time screening is recommended by Center for Disease Control  (CDC) for  adults born from [birth year] through 1967.   Completed  . Pneumonia vaccines  Completed  *Topic was postponed. The date shown is not the original due date.   Preventive Care for Adults  A healthy lifestyle and preventive care can promote health and wellness. Preventive health guidelines for adults include the following key practices.  . A routine yearly physical is a good way to check with your health care provider about your health and preventive screening. It is a chance to share any concerns and updates on your health and to receive a thorough exam.  . Visit your dentist for a routine exam and preventive care every 6 months. Brush your teeth twice a day and floss once a day. Good oral hygiene prevents tooth decay and gum disease.  . The frequency of eye exams is based on your age, health, family medical history, use  of contact lenses, and other factors. Follow your health care provider's ecommendations for frequency of eye exams.  . Eat a healthy diet. Foods like vegetables, fruits, whole grains, low-fat dairy products, and lean protein foods contain the nutrients you need without too many calories.  Decrease your intake of foods high in solid fats, added sugars, and salt. Eat the right amount of calories for you. Get information about a proper diet from your health care provider, if necessary.  . Regular physical exercise is one of the most important things you can do for your health. Most adults should get at least 150 minutes of moderate-intensity exercise (any activity that increases your heart rate and causes you to sweat) each week. In addition, most adults need muscle-strengthening exercises on 2 or more days a week.  Silver Sneakers may be a benefit available to you. To determine eligibility, you may visit the website: www.silversneakers.com or contact program at [phone])[phone] Mon-Fri between 8AM-8PM.   . Maintain a healthy weight. The body mass index (BMI) is a screening tool to identify possible weight problems. It provides an estimate of body fat based on height and weight. Your health care provider can find your BMI and can help you achieve or maintain a healthy weight.   For adults 20 years and older: ? A BMI below 18.5 is considered underweight. ? A BMI of 18.5 to 24.9 is normal. ? A BMI of 25 to 29.9 is considered overweight. ? A BMI of 30 and above is considered obese.   . Maintain normal blood lipids and cholesterol levels by exercising and minimizing your intake of saturated fat. Eat a balanced diet  with plenty of fruit and vegetables. Blood tests for lipids and cholesterol should begin at age 65 and be repeated every 5 years. If your lipid or cholesterol levels are high, you are over 50, or you are at high risk for heart disease, you may need your cholesterol levels checked more frequently. Ongoing high lipid and cholesterol levels should be treated with medicines if diet and exercise are not working.  . If you smoke, find out from your health care provider how to quit. If you do not use tobacco, please do not start.  . If you choose to drink alcohol, please do not consume  more than 2 drinks per day. One drink is considered to be 12 ounces (355 mL) of beer, 5 ounces (148 mL) of wine, or 1.5 ounces (44 mL) of liquor.  . If you are 101-35 years old, ask your health care provider if you should take aspirin to prevent strokes.  . Use sunscreen. Apply sunscreen liberally and repeatedly throughout the day. You should seek shade when your shadow is shorter than you. Protect yourself by wearing long sleeves, pants, a wide-brimmed hat, and sunglasses year round, whenever you are outdoors.  . Once a month, do a whole body skin exam, using a mirror to look at the skin on your back. Tell your health care provider of new moles, moles that have irregular borders, moles that are larger than a pencil eraser, or moles that have changed in shape or color.

## 2016-04-08 ENCOUNTER — Encounter: Payer: Self-pay | Admitting: Family Medicine

## 2016-04-08 ENCOUNTER — Ambulatory Visit (INDEPENDENT_AMBULATORY_CARE_PROVIDER_SITE_OTHER): Payer: Medicare Other | Admitting: Family Medicine

## 2016-04-08 DIAGNOSIS — I2510 Atherosclerotic heart disease of native coronary artery without angina pectoris: Secondary | ICD-10-CM

## 2016-04-08 DIAGNOSIS — F17200 Nicotine dependence, unspecified, uncomplicated: Secondary | ICD-10-CM

## 2016-04-08 DIAGNOSIS — H6122 Impacted cerumen, left ear: Secondary | ICD-10-CM

## 2016-04-08 DIAGNOSIS — E785 Hyperlipidemia, unspecified: Secondary | ICD-10-CM | POA: Diagnosis not present

## 2016-04-08 DIAGNOSIS — I700 Atherosclerosis of aorta: Secondary | ICD-10-CM

## 2016-04-08 DIAGNOSIS — R972 Elevated prostate specific antigen [PSA]: Secondary | ICD-10-CM

## 2016-04-08 DIAGNOSIS — J449 Chronic obstructive pulmonary disease, unspecified: Secondary | ICD-10-CM

## 2016-04-08 MED ORDER — ATORVASTATIN CALCIUM 20 MG PO TABS: 20.0000 mg | ORAL_TABLET | Freq: Every day | ORAL | 3 refills | Status: DC

## 2016-04-08 NOTE — Progress Notes (Signed)
BP 120/70 (BP Location: Left Arm, Patient Position: Sitting, Cuff Size: Normal)   Pulse 65   Wt 177 lb (80.3 kg)   SpO2 98%   BMI 24.86 kg/m    CC: f/u AMW Subjective:    Patient ID: Richard Powell, male    DOB: [birth year], 71 y.o.   MRN: [id number]  HPI: Richard Powell is a 71 y.o. male presenting on 04/08/2016 for Medicare Wellness (Part 2, Saw Katha Cabal 04-06-16 for part 1.)   Saw Katha Cabal this week for medicare wellness visit. Note reviewed.    Preventative: COLONOSCOPY 04/2015 hyperplastic polyps, severe diverticulosis, int hem, rpt 10 yrs ([facility]) Prostate cancer screening - h/o elevated PSA in past. No fmhx prostate cancer. ++BPH sxs. Follows with Dr. Junious Silk yearly in August.  Lung cancer screening - receiving LRCT lung scan yearly - stable, mild emphysema Flu shot yearly Pneumovax - 2013. prevnar 2016 Td 2007 zostavax - declines. Did not have chicken pox.  Advanced directives: has living will at home. HCPOA would be wife then oldest son. Copy in chart. Seat belt use discussed Sunscreen use discussed. No changing moles on skin.  Smoking 1 ppd x 54 yrs, planning cutting down - currently 3/4 ppd Alcohol on weekends   Married, lives with wife Children 3- out of home [employer] - served in [employer]  Occupation: Engineer, civil (consulting) at [employer], retired June 2015 Activity: stays active at home, walking more, golfing Diet: some water, fruits/vegetables daily  Relevant past medical, surgical, family and social history reviewed and updated as indicated. Interim medical history since our last visit reviewed. Allergies and medications reviewed and updated. Current Outpatient Prescriptions on File Prior to Visit  Medication Sig  . aspirin 81 MG tablet Take 81 mg by mouth 3 (three) times a week.  . Aspirin-Caffeine (BAYER BACK & BODY) 500-32.5 MG TABS Take 1 tablet by mouth as needed. Reported on 04/29/2015  . finasteride (PROSCAR) 5 MG tablet Take 5 mg by mouth daily.  . Multiple  Vitamins-Minerals (MULTIVITAMIN PO) Take 1 tablet by mouth daily.   No current facility-administered medications on file prior to visit.     Review of Systems Per HPI unless specifically indicated in ROS section     Objective:    BP 120/70 (BP Location: Left Arm, Patient Position: Sitting, Cuff Size: Normal)   Pulse 65   Wt 177 lb (80.3 kg)   SpO2 98%   BMI 24.86 kg/m   Wt Readings from Last 3 Encounters:  04/08/16 177 lb (80.3 kg)  04/06/16 176 lb 8 oz (80.1 kg)  04/29/15 179 lb (81.2 kg)    Physical Exam  Constitutional: He is oriented to person, place, and time. He appears well-developed and well-nourished. No distress.  HENT:  Head: Normocephalic and atraumatic.  Right Ear: Hearing, tympanic membrane, external ear and ear canal normal.  Left Ear: Hearing, tympanic membrane, external ear and ear canal normal.  Nose: Nose normal.  Mouth/Throat: Uvula is midline, oropharynx is clear and moist and mucous membranes are normal. No oropharyngeal exudate, posterior oropharyngeal edema or posterior oropharyngeal erythema.  Eyes: Conjunctivae and EOM are normal. Pupils are equal, round, and reactive to light. No scleral icterus.  Neck: Normal range of motion. Neck supple. Carotid bruit is not present. No thyromegaly present.  Cardiovascular: Normal rate, regular rhythm, normal heart sounds and intact distal pulses.   No murmur heard. Pulses:      Radial pulses are 2+ on the right side, and 2+ on the left  side.  Pulmonary/Chest: Effort normal and breath sounds normal. No respiratory distress. He has no wheezes. He has no rales.  Abdominal: Soft. Bowel sounds are normal. He exhibits no distension and no mass. There is no tenderness. There is no rebound and no guarding.  Musculoskeletal: Normal range of motion. He exhibits no edema.  Lymphadenopathy:    He has no cervical adenopathy.  Neurological: He is alert and oriented to person, place, and time.  CN grossly intact, station and  gait intact  Skin: Skin is warm and dry. No rash noted.  Psychiatric: He has a normal mood and affect. His behavior is normal. Judgment and thought content normal.  Nursing note and vitals reviewed.  Results for orders placed or performed in visit on 04/06/16  Comprehensive metabolic panel  Result Value Ref Range   Sodium 139 135 - 145 mEq/L   Potassium 4.8 3.5 - 5.1 mEq/L   Chloride 107 96 - 112 mEq/L   CO2 31 19 - 32 mEq/L   Glucose, Bld 94 70 - 99 mg/dL   BUN 16 6 - 23 mg/dL   Creatinine, Ser 0.95 0.40 - 1.50 mg/dL   Total Bilirubin 0.8 0.2 - 1.2 mg/dL   Alkaline Phosphatase 84 39 - 117 U/L   AST 19 0 - 37 U/L   ALT 18 0 - 53 U/L   Total Protein 6.8 6.0 - 8.3 g/dL   Albumin 4.4 3.5 - 5.2 g/dL   Calcium 10.1 8.4 - 10.5 mg/dL   GFR 82.57 >60.00 mL/min  Lipid panel  Result Value Ref Range   Cholesterol 140 0 - 200 mg/dL   Triglycerides 161.0 (H) 0.0 - 149.0 mg/dL   HDL 47.00 >39.00 mg/dL   VLDL 32.2 0.0 - 40.0 mg/dL   LDL Cholesterol 61 0 - 99 mg/dL   Total CHOL/HDL Ratio 3    NonHDL 93.27   PSA, Medicare  Result Value Ref Range   PSA 2.93 0.10 - 4.00 ng/ml      Assessment & Plan:   Problem List Items Addressed This Visit    CAD (coronary artery disease), native coronary artery    By imaging. Continue statin, aspirin.       Relevant Medications   atorvastatin (LIPITOR) 20 MG tablet   Cerumen impaction    L irrigation performed today. Pt failed hearing screen and home cerumenolytic therapy      COPD (chronic obstructive pulmonary disease) ([place])    By imaging pt denies sxs.       ELEVATED PROSTATE SPECIFIC ANTIGEN    On finasteride. PSA stable. Sees urology early Junious Silk). Labs will be faxed to uro today.       HLD (hyperlipidemia)    Chronic, stable. Continue statin. Discussed diet changes to help control triglycerides      Relevant Medications   atorvastatin (LIPITOR) 20 MG tablet   Smoker    Continue to encourage cessation. Action phase.        Thoracic aorta atherosclerosis (HCC)    Continue aspirin and statin      Relevant Medications   atorvastatin (LIPITOR) 20 MG tablet       Follow up plan: Return in about 1 year (around 04/08/2017) for medicare wellness visit.  Ria Bush, MD

## 2016-04-08 NOTE — Assessment & Plan Note (Signed)
By imaging. Continue statin, aspirin.

## 2016-04-08 NOTE — Assessment & Plan Note (Signed)
Continue to encourage cessation. Action phase.

## 2016-04-08 NOTE — Assessment & Plan Note (Signed)
Chronic, stable. Continue statin. Discussed diet changes to help control triglycerides

## 2016-04-08 NOTE — Assessment & Plan Note (Signed)
On finasteride. PSA stable. Sees urology early Junious Silk). Labs will be faxed to uro today.

## 2016-04-08 NOTE — Assessment & Plan Note (Signed)
By imaging pt denies sxs.

## 2016-04-08 NOTE — Assessment & Plan Note (Signed)
L irrigation performed today. Pt failed hearing screen and home cerumenolytic therapy

## 2016-04-08 NOTE — Progress Notes (Signed)
Pre visit review using our clinic review tool, if applicable. No additional management support is needed unless otherwise documented below in the visit note. 

## 2016-04-08 NOTE — Assessment & Plan Note (Signed)
Continue aspirin and statin. 

## 2016-04-08 NOTE — Patient Instructions (Addendum)
We will fax labs attn Dr Junious Silk.  Let's do an irrigation of the left ear.  Work on quitting smoking.  Good to see you today, call us with questions.   Health Maintenance, Male A healthy lifestyle and preventative care can promote health and wellness.  Maintain regular health, dental, and eye exams.  Eat a healthy diet. Foods like vegetables, fruits, whole grains, low-fat dairy products, and lean protein foods contain the nutrients you need and are low in calories. Decrease your intake of foods high in solid fats, added sugars, and salt. Get information about a proper diet from your health care provider, if necessary.  Regular physical exercise is one of the most important things you can do for your health. Most adults should get at least 150 minutes of moderate-intensity exercise (any activity that increases your heart rate and causes you to sweat) each week. In addition, most adults need muscle-strengthening exercises on 2 or more days a week.   Maintain a healthy weight. The body mass index (BMI) is a screening tool to identify possible weight problems. It provides an estimate of body fat based on height and weight. Your health care provider can find your BMI and can help you achieve or maintain a healthy weight. For males 20 years and older:  A BMI below 18.5 is considered underweight.  A BMI of 18.5 to 24.9 is normal.  A BMI of 25 to 29.9 is considered overweight.  A BMI of 30 and above is considered obese.  Maintain normal blood lipids and cholesterol by exercising and minimizing your intake of saturated fat. Eat a balanced diet with plenty of fruits and vegetables. Blood tests for lipids and cholesterol should begin at age 45 and be repeated every 5 years. If your lipid or cholesterol levels are high, you are over age 42, or you are at high risk for heart disease, you may need your cholesterol levels checked more frequently.Ongoing high lipid and cholesterol levels should be treated  with medicines if diet and exercise are not working.  If you smoke, find out from your health care provider how to quit. If you do not use tobacco, do not start.  Lung cancer screening is recommended for adults aged 81-80 years who are at high risk for developing lung cancer because of a history of smoking. A yearly low-dose CT scan of the lungs is recommended for people who have at least a 30-pack-year history of smoking and are current smokers or have quit within the past 15 years. A pack year of smoking is smoking an average of 1 pack of cigarettes a day for 1 year (for example, a 30-pack-year history of smoking could mean smoking 1 pack a day for 30 years or 2 packs a day for 15 years). Yearly screening should continue until the smoker has stopped smoking for at least 15 years. Yearly screening should be stopped for people who develop a health problem that would prevent them from having lung cancer treatment.  If you choose to drink alcohol, do not have more than 2 drinks per day. One drink is considered to be 12 oz (360 mL) of beer, 5 oz (150 mL) of wine, or 1.5 oz (45 mL) of liquor.  Avoid the use of street drugs. Do not share needles with anyone. Ask for help if you need support or instructions about stopping the use of drugs.  High blood pressure causes heart disease and increases the risk of stroke. High blood pressure is more likely  to develop in:  People who have blood pressure in the end of the normal range (100-139/85-89 mm Hg).  People who are overweight or obese.  People who are African American.  If you are 15-30 years of age, have your blood pressure checked every 3-5 years. If you are 66 years of age or older, have your blood pressure checked every year. You should have your blood pressure measured twice-once when you are at a hospital or clinic, and once when you are not at a hospital or clinic. Record the average of the two measurements. To check your blood pressure when you are  not at a hospital or clinic, you can use:  An automated blood pressure machine at a pharmacy.  A home blood pressure monitor.  If you are 51-29 years old, ask your health care provider if you should take aspirin to prevent heart disease.  Diabetes screening involves taking a blood sample to check your fasting blood sugar level. This should be done once every 3 years after age 17 if you are at a normal weight and without risk factors for diabetes. Testing should be considered at a younger age or be carried out more frequently if you are overweight and have at least 1 risk factor for diabetes.  Colorectal cancer can be detected and often prevented. Most routine colorectal cancer screening begins at the age of 29 and continues through age 11. However, your health care provider may recommend screening at an earlier age if you have risk factors for colon cancer. On a yearly basis, your health care provider may provide home test kits to check for hidden blood in the stool. A small camera at the end of a tube may be used to directly examine the colon (sigmoidoscopy or colonoscopy) to detect the earliest forms of colorectal cancer. Talk to your health care provider about this at age 79 when routine screening begins. A direct exam of the colon should be repeated every 5-10 years through age 57, unless early forms of precancerous polyps or small growths are found.  People who are at an increased risk for hepatitis B should be screened for this virus. You are considered at high risk for hepatitis B if:  You were born in a country where hepatitis B occurs often. Talk with your health care provider about which countries are considered high risk.  Your parents were born in a high-risk country and you have not received a shot to protect against hepatitis B (hepatitis B vaccine).  You have HIV or AIDS.  You use needles to inject street drugs.  You live with, or have sex with, someone who has hepatitis  B.  You are a man who has sex with other men (MSM).  You get hemodialysis treatment.  You take certain medicines for conditions like cancer, organ transplantation, and autoimmune conditions.  Hepatitis C blood testing is recommended for all people born from [birth year] through 1967 and any individual with known risk factors for hepatitis C.  Healthy men should no longer receive prostate-specific antigen (PSA) blood tests as part of routine cancer screening. Talk to your health care provider about prostate cancer screening.  Testicular cancer screening is not recommended for adolescents or adult males who have no symptoms. Screening includes self-exam, a health care provider exam, and other screening tests. Consult with your health care provider about any symptoms you have or any concerns you have about testicular cancer.  Practice safe sex. Use condoms and avoid high-risk sexual practices to  reduce the spread of sexually transmitted infections (STIs).  You should be screened for STIs, including gonorrhea and chlamydia if:  You are sexually active and are younger than 24 years.  You are older than 24 years, and your health care provider tells you that you are at risk for this type of infection.  Your sexual activity has changed since you were last screened, and you are at an increased risk for chlamydia or gonorrhea. Ask your health care provider if you are at risk.  If you are at risk of being infected with HIV, it is recommended that you take a prescription medicine daily to prevent HIV infection. This is called pre-exposure prophylaxis (PrEP). You are considered at risk if:  You are a man who has sex with other men (MSM).  You are a heterosexual man who is sexually active with multiple partners.  You take drugs by injection.  You are sexually active with a partner who has HIV.  Talk with your health care provider about whether you are at high risk of being infected with HIV. If you  choose to begin PrEP, you should first be tested for HIV. You should then be tested every 3 months for as long as you are taking PrEP.  Use sunscreen. Apply sunscreen liberally and repeatedly throughout the day. You should seek shade when your shadow is shorter than you. Protect yourself by wearing long sleeves, pants, a wide-brimmed hat, and sunglasses year round whenever you are outdoors.  Tell your health care provider of new moles or changes in moles, especially if there is a change in shape or color. Also, tell your health care provider if a mole is larger than the size of a pencil eraser.  A one-time screening for abdominal aortic aneurysm (AAA) and surgical repair of large AAAs by ultrasound is recommended for men aged 48-75 years who are current or former smokers.  Stay current with your vaccines (immunizations). This information is not intended to replace advice given to you by your health care provider. Make sure you discuss any questions you have with your health care provider. Document Released: 08/06/2007 Document Revised: 02/28/2014 Document Reviewed: 11/11/2014 Elsevier Interactive Patient Education  2017 [employer].

## 2016-05-24 ENCOUNTER — Ambulatory Visit (INDEPENDENT_AMBULATORY_CARE_PROVIDER_SITE_OTHER)
Admission: RE | Admit: 2016-05-24 | Discharge: 2016-05-24 | Disposition: A | Discharge status: Home / Self Care | Payer: Medicare Other | Source: Ambulatory Visit | Attending: Acute Care | Admitting: Acute Care

## 2016-05-24 DIAGNOSIS — Z87891 Personal history of nicotine dependence: Secondary | ICD-10-CM | POA: Diagnosis not present

## 2016-05-24 DIAGNOSIS — F17210 Nicotine dependence, cigarettes, uncomplicated: Principal | ICD-10-CM

## 2016-06-07 ENCOUNTER — Telehealth: Payer: Self-pay | Admitting: Acute Care

## 2016-06-07 ENCOUNTER — Other Ambulatory Visit: Payer: Self-pay | Admitting: Acute Care

## 2016-06-07 DIAGNOSIS — F17210 Nicotine dependence, cigarettes, uncomplicated: Secondary | ICD-10-CM

## 2016-06-07 NOTE — Telephone Encounter (Signed)
I have spoken with Richard Powell about his low dose CT scan results. His scan was read as a  Lung  RADS 3, indicating nodules that are probably benign findings, short term follow up suggested: includes nodules with a low likelihood of becoming a clinically active cancer. Radiology recommends a 6 month repeat LDCT follow up.I explained that we will schedule his repeat scan for 11/2016. I also explained that his scan indicated some CAD. Pt. Is currently on statin therapy per his PCP and has his cholesterol checked on a regular basis.We will send a copy of the scan results to the patient's PCP for completeness of the medical record. Pt. Verbalized understanding of the above and had no further questions upon completion of the call.
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
## 2016-06-08 ENCOUNTER — Other Ambulatory Visit: Payer: Self-pay | Admitting: Acute Care

## 2016-06-27 DIAGNOSIS — H2511 Age-related nuclear cataract, right eye: Secondary | ICD-10-CM | POA: Diagnosis not present

## 2016-07-12 DIAGNOSIS — H2511 Age-related nuclear cataract, right eye: Secondary | ICD-10-CM | POA: Diagnosis not present

## 2016-07-28 ENCOUNTER — Encounter: Payer: Self-pay | Admitting: *Deleted

## 2016-08-02 ENCOUNTER — Ambulatory Visit: Payer: Medicare Other | Admitting: Anesthesiology

## 2016-08-02 ENCOUNTER — Ambulatory Visit
Admission: RE | Admit: 2016-08-02 | Discharge: 2016-08-02 | Disposition: A | Discharge status: Home / Self Care | Payer: Medicare Other | Source: Ambulatory Visit | Attending: Ophthalmology | Admitting: Ophthalmology

## 2016-08-02 ENCOUNTER — Encounter
Admission: RE | Disposition: A | Discharge status: Home / Self Care | Payer: Self-pay | Source: Ambulatory Visit | Attending: Ophthalmology

## 2016-08-02 DIAGNOSIS — H2511 Age-related nuclear cataract, right eye: Secondary | ICD-10-CM | POA: Diagnosis not present

## 2016-08-02 DIAGNOSIS — I739 Peripheral vascular disease, unspecified: Secondary | ICD-10-CM | POA: Diagnosis not present

## 2016-08-02 DIAGNOSIS — G4730 Sleep apnea, unspecified: Secondary | ICD-10-CM | POA: Insufficient documentation

## 2016-08-02 DIAGNOSIS — E7800 Pure hypercholesterolemia, unspecified: Secondary | ICD-10-CM | POA: Diagnosis not present

## 2016-08-02 DIAGNOSIS — F17210 Nicotine dependence, cigarettes, uncomplicated: Secondary | ICD-10-CM | POA: Insufficient documentation

## 2016-08-02 DIAGNOSIS — J449 Chronic obstructive pulmonary disease, unspecified: Secondary | ICD-10-CM | POA: Insufficient documentation

## 2016-08-02 DIAGNOSIS — I2510 Atherosclerotic heart disease of native coronary artery without angina pectoris: Secondary | ICD-10-CM | POA: Insufficient documentation

## 2016-08-02 HISTORY — PX: CATARACT EXTRACTION W/PHACO: SHX586

## 2016-08-02 SURGERY — PHACOEMULSIFICATION, CATARACT, WITH IOL INSERTION
Anesthesia: Monitor Anesthesia Care | Site: Eye | Laterality: Right | Wound class: Clean

## 2016-08-02 MED ORDER — ONDANSETRON HCL 4 MG/2ML IJ SOLN
INTRAMUSCULAR | Status: AC
  Filled 2016-08-02: qty 2

## 2016-08-02 MED ORDER — POVIDONE-IODINE 5 % OP SOLN
OPHTHALMIC | Status: DC | PRN
  Administered 2016-08-02: 1 via OPHTHALMIC

## 2016-08-02 MED ORDER — SODIUM CHLORIDE 0.9 % IV SOLN
INTRAVENOUS | Status: DC
  Administered 2016-08-02: 07:00:00 via INTRAVENOUS

## 2016-08-02 MED ORDER — MIDAZOLAM HCL 2 MG/2ML IJ SOLN
INTRAMUSCULAR | Status: DC | PRN
  Administered 2016-08-02 (×2): 1 mg via INTRAVENOUS

## 2016-08-02 MED ORDER — LIDOCAINE HCL (PF) 2 % IJ SOLN
INTRAMUSCULAR | Status: AC
  Filled 2016-08-02: qty 2

## 2016-08-02 MED ORDER — EPINEPHRINE PF 1 MG/ML IJ SOLN
INTRAMUSCULAR | Status: AC
  Filled 2016-08-02: qty 2

## 2016-08-02 MED ORDER — ONDANSETRON HCL 4 MG/2ML IJ SOLN
INTRAMUSCULAR | Status: DC | PRN
  Administered 2016-08-02: 4 mg via INTRAVENOUS

## 2016-08-02 MED ORDER — LIDOCAINE HCL (PF) 4 % IJ SOLN
INTRAOCULAR | Status: DC | PRN
  Administered 2016-08-02: 2 mL via OPHTHALMIC

## 2016-08-02 MED ORDER — MOXIFLOXACIN HCL 0.5 % OP SOLN
OPHTHALMIC | Status: DC | PRN
  Administered 2016-08-02: .2 mL via OPHTHALMIC

## 2016-08-02 MED ORDER — NA CHONDROIT SULF-NA HYALURON 40-17 MG/ML IO SOLN
INTRAOCULAR | Status: AC
  Filled 2016-08-02: qty 1

## 2016-08-02 MED ORDER — GLYCOPYRROLATE 0.2 MG/ML IJ SOLN
INTRAMUSCULAR | Status: DC | PRN
  Administered 2016-08-02: 0.2 mg via INTRAVENOUS

## 2016-08-02 MED ORDER — POVIDONE-IODINE 5 % OP SOLN
OPHTHALMIC | Status: AC
  Filled 2016-08-02: qty 30

## 2016-08-02 MED ORDER — ARMC OPHTHALMIC DILATING DROPS
1.0000 "application " | OPHTHALMIC | Status: AC
  Administered 2016-08-02 (×3): 1 via OPHTHALMIC

## 2016-08-02 MED ORDER — MOXIFLOXACIN HCL 0.5 % OP SOLN: 1.0000 [drp] | OPHTHALMIC | Status: DC | PRN

## 2016-08-02 MED ORDER — FENTANYL CITRATE (PF) 100 MCG/2ML IJ SOLN
INTRAMUSCULAR | Status: DC | PRN
  Administered 2016-08-02 (×2): 25 ug via INTRAVENOUS
  Administered 2016-08-02: 50 ug via INTRAVENOUS

## 2016-08-02 MED ORDER — CARBACHOL 0.01 % IO SOLN
INTRAOCULAR | Status: DC | PRN
Start: 2016-08-02 — End: 2016-08-02
  Administered 2016-08-02: .5 mL via INTRAOCULAR

## 2016-08-02 MED ORDER — EPINEPHRINE PF 1 MG/ML IJ SOLN
INTRAOCULAR | Status: DC | PRN
  Administered 2016-08-02: 1 mL via OPHTHALMIC

## 2016-08-02 MED ORDER — FENTANYL CITRATE (PF) 100 MCG/2ML IJ SOLN
INTRAMUSCULAR | Status: AC
  Filled 2016-08-02: qty 2

## 2016-08-02 MED ORDER — NA CHONDROIT SULF-NA HYALURON 40-17 MG/ML IO SOLN
INTRAOCULAR | Status: DC | PRN
  Administered 2016-08-02: 1 mL via INTRAOCULAR

## 2016-08-02 SURGICAL SUPPLY — 16 items
GLOVE BIO SURGEON STRL SZ8 (GLOVE) ×3 IMPLANT
GLOVE BIOGEL M 6.5 STRL (GLOVE) ×3 IMPLANT
GLOVE SURG LX 8.0 MICRO (GLOVE) ×2
GLOVE SURG LX STRL 8.0 MICRO (GLOVE) ×1 IMPLANT
GOWN STRL REUS W/ TWL LRG LVL3 (GOWN DISPOSABLE) ×2 IMPLANT
GOWN STRL REUS W/TWL LRG LVL3 (GOWN DISPOSABLE) ×6
LENS IOL ACRSF IQ TRC 3 17.5 IMPLANT
LENS IOL ACRYSOF IQ TORIC 17.5 ×3 IMPLANT
LENS IOL IQ TORIC 3 17.5 ×1 IMPLANT
PACK CATARACT (MISCELLANEOUS) ×3 IMPLANT
PACK CATARACT BRASINGTON LX (MISCELLANEOUS) ×3 IMPLANT
SOL BSS BAG (MISCELLANEOUS) ×3
SOLUTION BSS BAG (MISCELLANEOUS) ×1 IMPLANT
SYR 5ML LL (SYRINGE) ×3 IMPLANT
WATER STERILE IRR 250ML POUR (IV SOLUTION) ×3 IMPLANT
WIPE NON LINTING 3.25X3.25 (MISCELLANEOUS) ×3 IMPLANT

## 2016-08-02 NOTE — Anesthesia Postprocedure Evaluation (Signed)
Anesthesia Post Note  Patient: Richard Powell  Procedure(s) Performed: Procedure(s) (LRB): CATARACT EXTRACTION PHACO AND INTRAOCULAR LENS PLACEMENT (IOC) (Right)  Patient location during evaluation: PACU Anesthesia Type: MAC Level of consciousness: awake and alert Pain management: pain level controlled Vital Signs Assessment: post-procedure vital signs reviewed and stable Respiratory status: spontaneous breathing, nonlabored ventilation, respiratory function stable and patient connected to nasal cannula oxygen Cardiovascular status: stable and blood pressure returned to baseline Anesthetic complications: no     Last Vitals:  Vitals:   08/02/16 0733 08/02/16 0857  BP: 140/63 (!) 146/62  Pulse: (!) 50 (!) 53  Resp: 18 12  Temp: 36.3 C 36.6 C    Last Pain:  Vitals:   08/02/16 0857  TempSrc: Oral                 Alison Stalling

## 2016-08-02 NOTE — H&P (Signed)
All labs reviewed. Abnormal studies sent to patients PCP when indicated.  Previous H&P reviewed, patient examined, there are NO CHANGES.  Richard Alkins LOUIS6/12/20188:18 AM

## 2016-08-02 NOTE — Transfer of Care (Signed)
Anesthesia Post Note  Patient: Richard Powell  Procedure(s) Performed: Procedure(s) (LRB): CATARACT EXTRACTION PHACO AND INTRAOCULAR LENS PLACEMENT (IOC) (Right)  Anesthesia type: MAC  Patient location: Phase II  Post pain: Pain level controlled  Post assessment: Post-op Vital signs reviewed  Last Vitals:  Vitals:   08/02/16 0733 08/02/16 0857  BP: 140/63 (!) 146/62  Pulse: (!) 50 (!) 53  Resp: 18 12  Temp: 36.3 C 36.6 C    Post vital signs: stable  Level of consciousness: Patient remains intubated per anesthesia plan  Complications: No apparent anesthesia complications

## 2016-08-02 NOTE — Anesthesia Post-op Follow-up Note (Cosign Needed)
Anesthesia QCDR form completed.        

## 2016-08-02 NOTE — Op Note (Signed)
PREOPERATIVE DIAGNOSIS:  Nuclear sclerotic cataract of the right eye.   POSTOPERATIVE DIAGNOSIS:  Nuclear sclerotic cataract of the right eye.   OPERATIVE PROCEDURE: Procedure(s): CATARACT EXTRACTION PHACO AND INTRAOCULAR LENS PLACEMENT (IOC)   SURGEON:  Birder Robson, MD.   ANESTHESIA: 1.      Managed anesthesia care. 2.     0.38ml of Shugarcaine was instilled following the paracentesis  Anesthesiologist: Gunnar Fusi, MD CRNA: Doreen Salvage, CRNA; Jonna Clark, CRNA  COMPLICATIONS:  None.   TECHNIQUE:   Stop and chop    DESCRIPTION OF PROCEDURE:  The patient was examined and consented in the preoperative holding area where the aforementioned topical anesthesia was applied to the right eye.  The patient was brought back to the Operating Room where he was sat upright on the gurney and given a target to fixate upon while the eye was marked at the 3:00 and 9:00 position.  The patient was then reclined on the operating table.  The eye was prepped and draped in the usual sterile ophthalmic fashion and a lid speculum was placed. A paracentesis was created with the side port blade and the anterior chamber was filled with viscoelastic. A near clear corneal incision was performed with the steel keratome. A continuous curvilinear capsulorrhexis was performed with a cystotome followed by the capsulorrhexis forceps. Hydrodissection and hydrodelineation were carried out with BSS on a blunt cannula. The lens was removed in a stop and chop technique and the remaining cortical material was removed with the irrigation-aspiration handpiece. The eye was inflated with viscoelastic and the ZCT  lens  was placed in the eye and rotated to within a few degrees of the predetermined orientation.  The remaining viscoelastic was removed from the eye.  The Sinskey hook was used to rotate the toric lens into its final resting place at 116 degrees.  0. The eye was inflated to a physiologic pressure and found to be  watertight. 0.55ml of Vigamox was placed in the anterior chamber.  The eye was dressed with Vigamox. The patient was given protective glasses to wear throughout the day and a shield with which to sleep tonight. The patient was also given drops with which to begin a drop regimen today and will follow-up with me in one day.  Implant Name Type Inv. Item Serial No. Manufacturer Lot No. LRB No. Used  LENS IOL TORIC 17.5 - [id number]   LENS IOL TORIC 17.5 [id number] ALCON   Right 1   Procedure(s) with comments: CATARACT EXTRACTION PHACO AND INTRAOCULAR LENS PLACEMENT (IOC) (Right) - [place] 01:23 AP% 18.0 CDE 14.92 Fluid pack lot # [id number]  Electronically signed: [place] 08/02/2016 8:53 AM

## 2016-08-02 NOTE — Discharge Instructions (Signed)
FOLLOW DR. PORFILIO'S POSTOP DISCHARGE INSTRUCTION SHEET AS REVIEWED.  Eye Surgery Discharge Instructions  Expect mild scratchy sensation or mild soreness. DO NOT RUB YOUR EYE!  The day of surgery:  Minimal physical activity, but bed rest is not required  No reading, computer work, or close hand work  No bending, lifting, or straining.  May watch TV  For 24 hours:  No driving, legal decisions, or alcoholic beverages  Safety precautions  Eat anything you prefer: It is better to start with liquids, then soup then solid foods.  _____ Eye patch should be worn until postoperative exam tomorrow.  ____ Solar shield eyeglasses should be worn for comfort in the sunlight/patch while sleeping  Resume all regular medications including aspirin or Coumadin if these were discontinued prior to surgery. You may shower, bathe, shave, or wash your hair. Tylenol may be taken for mild discomfort.  Call your doctor if you experience significant pain, nausea, or vomiting, fever > 101 or other signs of infection. [phone] or [phone] Specific instructions:  Follow-up Information    Birder Robson, MD Follow up.   Specialty:  Ophthalmology Why:  Wednesday 08/03/16 @ 09:05 am Contact information: [address] [place] [id number] [phone]

## 2016-08-02 NOTE — Anesthesia Preprocedure Evaluation (Signed)
Anesthesia Evaluation  Patient identified by MRN, date of birth, ID band Patient awake    Reviewed: Allergy & Precautions, NPO status , Patient's Chart, lab work & pertinent test results  History of Anesthesia Complications Negative for: history of anesthetic complications  Airway Mallampati: II       Dental  (+) Partial Lower, Partial Upper   Pulmonary sleep apnea , COPD, Current Smoker,           Cardiovascular + CAD and + Peripheral Vascular Disease       Neuro/Psych negative neurological ROS     GI/Hepatic negative GI ROS, Neg liver ROS,   Endo/Other  negative endocrine ROS  Renal/GU negative Renal ROS     Musculoskeletal   Abdominal   Peds  Hematology negative hematology ROS (+)   Anesthesia Other Findings   Reproductive/Obstetrics                             Anesthesia Physical Anesthesia Plan  ASA: III  Anesthesia Plan: MAC   Post-op Pain Management:    Induction: Intravenous  PONV Risk Score and Plan: 0  Airway Management Planned: Nasal Cannula  Additional Equipment:   Intra-op Plan:   Post-operative Plan:   Informed Consent: I have reviewed the patients History and Physical, chart, labs and discussed the procedure including the risks, benefits and alternatives for the proposed anesthesia with the patient or authorized representative who has indicated his/her understanding and acceptance.     Plan Discussed with:   Anesthesia Plan Comments:         Anesthesia Quick Evaluation

## 2016-08-10 DIAGNOSIS — H2512 Age-related nuclear cataract, left eye: Secondary | ICD-10-CM | POA: Diagnosis not present

## 2016-08-15 ENCOUNTER — Encounter: Payer: Self-pay | Admitting: *Deleted

## 2016-08-16 ENCOUNTER — Ambulatory Visit: Payer: Medicare Other | Admitting: Anesthesiology

## 2016-08-16 ENCOUNTER — Encounter: Payer: Self-pay | Admitting: *Deleted

## 2016-08-16 ENCOUNTER — Encounter
Admission: RE | Disposition: A | Discharge status: Home / Self Care | Payer: Self-pay | Source: Ambulatory Visit | Attending: Ophthalmology

## 2016-08-16 ENCOUNTER — Ambulatory Visit
Admission: RE | Admit: 2016-08-16 | Discharge: 2016-08-16 | Disposition: A | Discharge status: Home / Self Care | Payer: Medicare Other | Source: Ambulatory Visit | Attending: Ophthalmology | Admitting: Ophthalmology

## 2016-08-16 DIAGNOSIS — G4730 Sleep apnea, unspecified: Secondary | ICD-10-CM | POA: Diagnosis not present

## 2016-08-16 DIAGNOSIS — I2510 Atherosclerotic heart disease of native coronary artery without angina pectoris: Secondary | ICD-10-CM | POA: Diagnosis not present

## 2016-08-16 DIAGNOSIS — J449 Chronic obstructive pulmonary disease, unspecified: Secondary | ICD-10-CM | POA: Insufficient documentation

## 2016-08-16 DIAGNOSIS — H2512 Age-related nuclear cataract, left eye: Secondary | ICD-10-CM | POA: Insufficient documentation

## 2016-08-16 DIAGNOSIS — F17210 Nicotine dependence, cigarettes, uncomplicated: Secondary | ICD-10-CM | POA: Diagnosis not present

## 2016-08-16 HISTORY — PX: CATARACT EXTRACTION W/PHACO: SHX586

## 2016-08-16 SURGERY — PHACOEMULSIFICATION, CATARACT, WITH IOL INSERTION
Anesthesia: Monitor Anesthesia Care | Site: Eye | Laterality: Left | Wound class: Clean

## 2016-08-16 MED ORDER — BSS IO SOLN
INTRAOCULAR | Status: DC | PRN
  Administered 2016-08-16: 1 mL via OPHTHALMIC

## 2016-08-16 MED ORDER — MIDAZOLAM HCL 2 MG/2ML IJ SOLN
INTRAMUSCULAR | Status: AC
  Filled 2016-08-16: qty 2

## 2016-08-16 MED ORDER — NA CHONDROIT SULF-NA HYALURON 40-17 MG/ML IO SOLN
INTRAOCULAR | Status: DC | PRN
  Administered 2016-08-16: 1 mL via INTRAOCULAR

## 2016-08-16 MED ORDER — POVIDONE-IODINE 5 % OP SOLN
OPHTHALMIC | Status: DC | PRN
  Administered 2016-08-16: 1 via OPHTHALMIC

## 2016-08-16 MED ORDER — MIDAZOLAM HCL 2 MG/2ML IJ SOLN
INTRAMUSCULAR | Status: DC | PRN
  Administered 2016-08-16: .5 mg via INTRAVENOUS
  Administered 2016-08-16: 1 mg via INTRAVENOUS
  Administered 2016-08-16: .5 mg via INTRAVENOUS

## 2016-08-16 MED ORDER — MOXIFLOXACIN HCL 0.5 % OP SOLN: 1.0000 [drp] | OPHTHALMIC | Status: DC | PRN

## 2016-08-16 MED ORDER — EPINEPHRINE PF 1 MG/ML IJ SOLN
INTRAMUSCULAR | Status: AC
  Filled 2016-08-16: qty 2

## 2016-08-16 MED ORDER — LIDOCAINE HCL (PF) 4 % IJ SOLN
INTRAMUSCULAR | Status: AC
  Filled 2016-08-16: qty 5

## 2016-08-16 MED ORDER — NA CHONDROIT SULF-NA HYALURON 40-17 MG/ML IO SOLN
INTRAOCULAR | Status: AC
  Filled 2016-08-16: qty 1

## 2016-08-16 MED ORDER — CARBACHOL 0.01 % IO SOLN
INTRAOCULAR | Status: DC | PRN
  Administered 2016-08-16: .5 mL via INTRAOCULAR

## 2016-08-16 MED ORDER — ARMC OPHTHALMIC DILATING DROPS
OPHTHALMIC | Status: DC
Start: 2016-08-16 — End: 2016-08-16
  Filled 2016-08-16: qty 0.4

## 2016-08-16 MED ORDER — POVIDONE-IODINE 5 % OP SOLN
OPHTHALMIC | Status: AC
  Filled 2016-08-16: qty 30

## 2016-08-16 MED ORDER — SODIUM CHLORIDE 0.9 % IV SOLN
INTRAVENOUS | Status: DC
  Administered 2016-08-16 (×2): via INTRAVENOUS

## 2016-08-16 MED ORDER — LIDOCAINE HCL (PF) 4 % IJ SOLN
INTRAMUSCULAR | Status: DC | PRN
  Administered 2016-08-16: 2 mL via OPHTHALMIC

## 2016-08-16 MED ORDER — MOXIFLOXACIN HCL 0.5 % OP SOLN
OPHTHALMIC | Status: DC | PRN
  Administered 2016-08-16: .2 mL via OPHTHALMIC

## 2016-08-16 MED ORDER — ARMC OPHTHALMIC DILATING DROPS
1.0000 "application " | OPHTHALMIC | Status: AC
  Administered 2016-08-16 (×3): 1 via OPHTHALMIC

## 2016-08-16 MED ORDER — MOXIFLOXACIN HCL 0.5 % OP SOLN
OPHTHALMIC | Status: DC
Start: 2016-08-16 — End: 2016-08-16
  Filled 2016-08-16: qty 3

## 2016-08-16 SURGICAL SUPPLY — 16 items
GLOVE BIO SURGEON STRL SZ8 (GLOVE) ×3 IMPLANT
GLOVE BIOGEL M 6.5 STRL (GLOVE) ×3 IMPLANT
GLOVE SURG LX 8.0 MICRO (GLOVE) ×2
GLOVE SURG LX STRL 8.0 MICRO (GLOVE) ×1 IMPLANT
GOWN STRL REUS W/ TWL LRG LVL3 (GOWN DISPOSABLE) ×2 IMPLANT
GOWN STRL REUS W/TWL LRG LVL3 (GOWN DISPOSABLE) ×6
LENS IOL ACRSF IQ TRC 3 17.5 IMPLANT
LENS IOL ACRYSOF IQ TORIC 17.5 ×3 IMPLANT
LENS IOL IQ TORIC 3 17.5 ×1 IMPLANT
PACK CATARACT (MISCELLANEOUS) ×3 IMPLANT
PACK CATARACT BRASINGTON LX (MISCELLANEOUS) ×3 IMPLANT
SOL BSS BAG (MISCELLANEOUS) ×3
SOLUTION BSS BAG (MISCELLANEOUS) ×1 IMPLANT
SYR 5ML LL (SYRINGE) ×3 IMPLANT
WATER STERILE IRR 250ML POUR (IV SOLUTION) ×3 IMPLANT
WIPE NON LINTING 3.25X3.25 (MISCELLANEOUS) ×3 IMPLANT

## 2016-08-16 NOTE — Op Note (Signed)
PREOPERATIVE DIAGNOSIS:  Nuclear sclerotic cataract of the left eye.   POSTOPERATIVE DIAGNOSIS:  Nuclear sclerotic cataract of the left eye.   OPERATIVE PROCEDURE: Procedure(s): CATARACT EXTRACTION PHACO AND INTRAOCULAR LENS PLACEMENT (IOC)   SURGEON:  Birder Robson, MD.   ANESTHESIA: 1.      Managed anesthesia care. 2.     0.6ml os Shugarcaine was instilled following the paracentesis [email or web address]@   COMPLICATIONS:  None.   TECHNIQUE:   Stop and chop    DESCRIPTION OF PROCEDURE:  The patient was examined and consented in the preoperative holding area where the aforementioned topical anesthesia was applied to the left eye.  The patient was brought back to the Operating Room where he was sat upright on the gurney and given a target to fixate upon while the eye was marked at the 3:00 and 9:00 position.  The patient was then reclined on the operating table.  The eye was prepped and draped in the usual sterile ophthalmic fashion and a lid speculum was placed. A paracentesis was created with the side port blade and the anterior chamber was filled with viscoelastic. A near clear corneal incision was performed with the steel keratome. A continuous curvilinear capsulorrhexis was performed with a cystotome followed by the capsulorrhexis forceps. Hydrodissection and hydrodelineation were carried out with BSS on a blunt cannula. The lens was removed in a stop and chop technique and the remaining cortical material was removed with the irrigation-aspiration handpiece. The eye was inflated with viscoelastic and the ZCT lens was placed in the eye and rotated to within a few degrees of the predetermined orientation.  The remaining viscoelastic was removed from the eye.  The Sinskey hook was used to rotate the toric lens into its final resting place at 142 degrees.  0.1 ml of Vigamox was placed in the anterior chamber. The eye was inflated to a physiologic pressure and found to be watertight.  The eye was dressed  with Vigamox. The patient was given protective glasses to wear throughout the day and a shield with which to sleep tonight. The patient was also given drops with which to begin a drop regimen today and will follow-up with me in one day.  Implant Name Type Inv. Item Serial No. Manufacturer Lot No. LRB No. Used  LENS IOL TORIC 17.5 - [id number]   LENS IOL TORIC 17.5 [id number] ALCON   Left 1   Procedure(s) with comments: CATARACT EXTRACTION PHACO AND INTRAOCULAR LENS PLACEMENT (IOC) (Left) - [place] 01:20 AP% 24.7 CDE 19.76 Fluid pack lot # [id number]  Electronically signed: Arafat Cocuzza LOUIS 6/26/201810:07 AM

## 2016-08-16 NOTE — Discharge Instructions (Signed)
Eye Surgery Discharge Instructions  Expect mild scratchy sensation or mild soreness. DO NOT RUB YOUR EYE!  The day of surgery:  Minimal physical activity, but bed rest is not required  No reading, computer work, or close hand work  No bending, lifting, or straining.  May watch TV  For 24 hours:  No driving, legal decisions, or alcoholic beverages  Safety precautions  Eat anything you prefer: It is better to start with liquids, then soup then solid foods.  _____ Eye patch should be worn until postoperative exam tomorrow.  ____ Solar shield eyeglasses should be worn for comfort in the sunlight/patch while sleeping  Resume all regular medications including aspirin or Coumadin if these were discontinued prior to surgery. You may shower, bathe, shave, or wash your hair. Tylenol may be taken for mild discomfort.  Call your doctor if you experience significant pain, nausea, or vomiting, fever > 101 or other signs of infection. [phone] or [phone] Specific instructions:  Follow-up Information    Birder Robson, MD Follow up on 08/17/2016.   Specialty:  Ophthalmology Why:  10:40 Contact information: [address] [place] [address] [phone]

## 2016-08-16 NOTE — Anesthesia Procedure Notes (Signed)
Date/Time: 08/16/2016 9:43 AM Performed by: Nelda Marseille Pre-anesthesia Checklist: Patient identified, Emergency Drugs available, Suction available, Patient being monitored and Timeout performed Oxygen Delivery Method: Nasal cannula

## 2016-08-16 NOTE — Transfer of Care (Signed)
Immediate Anesthesia Transfer of Care Note  Patient: Richard Powell  Procedure(s) Performed: Procedure(s) with comments: CATARACT EXTRACTION PHACO AND INTRAOCULAR LENS PLACEMENT (IOC) (Left) - [place] 01:20 AP% 24.7 CDE 19.76 Fluid pack lot # [id number]  Patient Location: PACU  Anesthesia Type:MAC  Level of Consciousness: awake, alert  and oriented  Airway & Oxygen Therapy: Patient Spontanous Breathing  Post-op Assessment: Report given to RN and Post -op Vital signs reviewed and stable  Post vital signs: Reviewed and stable  Last Vitals:  Vitals:   08/16/16 0738  BP: 137/71  Pulse: (!) 52  Resp: 16  Temp: 36.1 C    Last Pain:  Vitals:   08/16/16 0738  TempSrc: Tympanic  PainSc: 0-No pain         Complications: No apparent anesthesia complications

## 2016-08-16 NOTE — Anesthesia Postprocedure Evaluation (Signed)
Anesthesia Post Note  Patient: Richard Powell  Procedure(s) Performed: Procedure(s) (LRB): CATARACT EXTRACTION PHACO AND INTRAOCULAR LENS PLACEMENT (IOC) (Left)  Patient location during evaluation: PACU Anesthesia Type: MAC Level of consciousness: awake Pain management: pain level controlled Vital Signs Assessment: post-procedure vital signs reviewed and stable Respiratory status: nonlabored ventilation Cardiovascular status: stable Anesthetic complications: no     Last Vitals:  Vitals:   08/16/16 1009 08/16/16 1021  BP: 137/65 (!) 156/73  Pulse: (!) 55 (!) 51  Resp: 16 16  Temp: 36.6 C     Last Pain:  Vitals:   08/16/16 1009  TempSrc: Oral  PainSc:                  VAN STAVEREN,Gerda Yin

## 2016-08-16 NOTE — H&P (Signed)
All labs reviewed. Abnormal studies sent to patients PCP when indicated.  Previous H&P reviewed, patient examined, there are NO CHANGES.  Mical Brun LOUIS6/26/20189:29 AM

## 2016-08-16 NOTE — Anesthesia Post-op Follow-up Note (Cosign Needed)
Anesthesia QCDR form completed.        

## 2016-08-16 NOTE — Anesthesia Preprocedure Evaluation (Signed)
Anesthesia Evaluation  Patient identified by MRN, date of birth, ID band Patient awake    Reviewed: Allergy & Precautions, NPO status , Patient's Chart, lab work & pertinent test results  Airway Mallampati: II       Dental  (+) Teeth Intact   Pulmonary sleep apnea , COPD, Current Smoker,     + decreased breath sounds      Cardiovascular + CAD   Rhythm:Regular Rate:Normal     Neuro/Psych negative neurological ROS  negative psych ROS   GI/Hepatic negative GI ROS, Neg liver ROS,   Endo/Other  negative endocrine ROS  Renal/GU negative Renal ROS     Musculoskeletal   Abdominal Normal abdominal exam  (+)   Peds negative pediatric ROS (+)  Hematology   Anesthesia Other Findings   Reproductive/Obstetrics                             Anesthesia Physical Anesthesia Plan  ASA: III  Anesthesia Plan: MAC   Post-op Pain Management:    Induction: Intravenous  PONV Risk Score and Plan: 0  Airway Management Planned: Natural Airway and Nasal Cannula  Additional Equipment:   Intra-op Plan:   Post-operative Plan:   Informed Consent: I have reviewed the patients History and Physical, chart, labs and discussed the procedure including the risks, benefits and alternatives for the proposed anesthesia with the patient or authorized representative who has indicated his/her understanding and acceptance.     Plan Discussed with: CRNA  Anesthesia Plan Comments:         Anesthesia Quick Evaluation

## 2016-11-25 ENCOUNTER — Encounter (INDEPENDENT_AMBULATORY_CARE_PROVIDER_SITE_OTHER): Payer: Self-pay

## 2016-11-25 ENCOUNTER — Ambulatory Visit (INDEPENDENT_AMBULATORY_CARE_PROVIDER_SITE_OTHER)
Admission: RE | Admit: 2016-11-25 | Discharge: 2016-11-25 | Disposition: A | Discharge status: Home / Self Care | Payer: Medicare Other | Source: Ambulatory Visit | Attending: Acute Care | Admitting: Acute Care

## 2016-11-25 DIAGNOSIS — C3490 Malignant neoplasm of unspecified part of unspecified bronchus or lung: Secondary | ICD-10-CM | POA: Diagnosis not present

## 2016-11-25 DIAGNOSIS — F17210 Nicotine dependence, cigarettes, uncomplicated: Secondary | ICD-10-CM

## 2016-11-25 DIAGNOSIS — R918 Other nonspecific abnormal finding of lung field: Secondary | ICD-10-CM | POA: Diagnosis not present

## 2016-11-28 ENCOUNTER — Other Ambulatory Visit: Payer: Self-pay | Admitting: Acute Care

## 2016-11-28 DIAGNOSIS — F17210 Nicotine dependence, cigarettes, uncomplicated: Principal | ICD-10-CM

## 2016-11-28 DIAGNOSIS — Z122 Encounter for screening for malignant neoplasm of respiratory organs: Secondary | ICD-10-CM

## 2016-12-05 DIAGNOSIS — R972 Elevated prostate specific antigen [PSA]: Secondary | ICD-10-CM | POA: Diagnosis not present

## 2016-12-05 DIAGNOSIS — N5201 Erectile dysfunction due to arterial insufficiency: Secondary | ICD-10-CM | POA: Diagnosis not present

## 2016-12-05 DIAGNOSIS — N401 Enlarged prostate with lower urinary tract symptoms: Secondary | ICD-10-CM | POA: Diagnosis not present

## 2016-12-05 DIAGNOSIS — R351 Nocturia: Secondary | ICD-10-CM | POA: Diagnosis not present

## 2017-03-06 DIAGNOSIS — R972 Elevated prostate specific antigen [PSA]: Secondary | ICD-10-CM | POA: Diagnosis not present

## 2017-03-13 DIAGNOSIS — H43813 Vitreous degeneration, bilateral: Secondary | ICD-10-CM | POA: Diagnosis not present

## 2017-03-15 ENCOUNTER — Other Ambulatory Visit: Payer: Self-pay | Admitting: Urology

## 2017-03-15 DIAGNOSIS — R972 Elevated prostate specific antigen [PSA]: Secondary | ICD-10-CM

## 2017-03-19 ENCOUNTER — Other Ambulatory Visit: Payer: Self-pay | Admitting: Family Medicine

## 2017-04-03 ENCOUNTER — Ambulatory Visit
Admission: RE | Admit: 2017-04-03 | Discharge: 2017-04-03 | Disposition: A | Discharge status: Home / Self Care | Payer: Medicare Other | Source: Ambulatory Visit | Attending: Urology | Admitting: Urology

## 2017-04-03 DIAGNOSIS — R972 Elevated prostate specific antigen [PSA]: Secondary | ICD-10-CM

## 2017-04-03 DIAGNOSIS — K5730 Diverticulosis of large intestine without perforation or abscess without bleeding: Secondary | ICD-10-CM | POA: Diagnosis not present

## 2017-04-03 MED ORDER — GADOBENATE DIMEGLUMINE 529 MG/ML IV SOLN: 17.0000 mL | Freq: Once | INTRAVENOUS | Status: DC | PRN

## 2017-04-05 ENCOUNTER — Other Ambulatory Visit: Payer: Self-pay | Admitting: Family Medicine

## 2017-04-05 DIAGNOSIS — E785 Hyperlipidemia, unspecified: Secondary | ICD-10-CM

## 2017-04-05 DIAGNOSIS — R972 Elevated prostate specific antigen [PSA]: Secondary | ICD-10-CM

## 2017-04-07 ENCOUNTER — Ambulatory Visit (INDEPENDENT_AMBULATORY_CARE_PROVIDER_SITE_OTHER): Payer: Medicare Other

## 2017-04-07 VITALS — BP 118/76 | HR 58 | Temp 97.8°F | Ht 70.5 in | Wt 176.5 lb

## 2017-04-07 DIAGNOSIS — E785 Hyperlipidemia, unspecified: Secondary | ICD-10-CM

## 2017-04-07 DIAGNOSIS — R972 Elevated prostate specific antigen [PSA]: Secondary | ICD-10-CM

## 2017-04-07 DIAGNOSIS — Z0000 Encounter for general adult medical examination without abnormal findings: Secondary | ICD-10-CM

## 2017-04-07 LAB — COMPREHENSIVE METABOLIC PANEL
ALBUMIN: 4.3 g/dL (ref 3.5–5.2)
ALK PHOS: 78 U/L (ref 39–117)
ALT: 18 U/L (ref 0–53)
AST: 20 U/L (ref 0–37)
BUN: 21 mg/dL (ref 6–23)
CHLORIDE: 106 meq/L (ref 96–112)
CO2: 30 mEq/L (ref 19–32)
Calcium: 9.3 mg/dL (ref 8.4–10.5)
Creatinine, Ser: 0.9 mg/dL (ref 0.40–1.50)
GFR: 87.65 mL/min (ref >60.00)
GLUCOSE: 94 mg/dL (ref 70–99)
POTASSIUM: 4.9 meq/L (ref 3.5–5.1)
Sodium: 141 mEq/L (ref 135–145)
TOTAL PROTEIN: 6.8 g/dL (ref 6.0–8.3)
Total Bilirubin: 0.8 mg/dL (ref 0.2–1.2)

## 2017-04-07 LAB — LIPID PANEL
CHOLESTEROL: 135 mg/dL (ref 0–200)
HDL: 46.8 mg/dL (ref >39.00)
LDL Cholesterol: 72 mg/dL (ref 0–99)
NonHDL: 88.47
Total CHOL/HDL Ratio: 3
Triglycerides: 82 mg/dL (ref 0.0–149.0)
VLDL: 16.4 mg/dL (ref 0.0–40.0)

## 2017-04-07 LAB — PSA: PSA: 3.76 ng/mL (ref 0.10–4.00)

## 2017-04-07 NOTE — Patient Instructions (Signed)
Richard Powell , Thank you for taking time to come for your Medicare Wellness Visit. I appreciate your ongoing commitment to your health goals. Please review the following plan we discussed and let me know if I can assist you in the future.   These are the goals we discussed: Goals    . DIET - REDUCE SUGAR INTAKE     Starting 04/21/2017, I will attempt to reduce intake of sugar by choosing healthy snacks.        This is a list of the screening recommended for you and due dates:  Health Maintenance  Topic Date Due  . DTaP/Tdap/Td vaccine (1 - Tdap) 06/20/2025*  . Tetanus Vaccine  06/20/2025*  . Colon Cancer Screening  04/28/2025  . Flu Shot  Completed  .  Hepatitis C: One time screening is recommended by Center for Disease Control  (CDC) for  adults born from [birth year] through 1967.   Completed  . Pneumonia vaccines  Completed  *Topic was postponed. The date shown is not the original due date.   Preventive Care for Adults  A healthy lifestyle and preventive care can promote health and wellness. Preventive health guidelines for adults include the following key practices.  . A routine yearly physical is a good way to check with your health care provider about your health and preventive screening. It is a chance to share any concerns and updates on your health and to receive a thorough exam.  . Visit your dentist for a routine exam and preventive care every 6 months. Brush your teeth twice a day and floss once a day. Good oral hygiene prevents tooth decay and gum disease.  . The frequency of eye exams is based on your age, health, family medical history, use  of contact lenses, and other factors. Follow your health care provider's recommendations for frequency of eye exams.  . Eat a healthy diet. Foods like vegetables, fruits, whole grains, low-fat dairy products, and lean protein foods contain the nutrients you need without too many calories. Decrease your intake of foods high in solid fats,  added sugars, and salt. Eat the right amount of calories for you. Get information about a proper diet from your health care provider, if necessary.  . Regular physical exercise is one of the most important things you can do for your health. Most adults should get at least 150 minutes of moderate-intensity exercise (any activity that increases your heart rate and causes you to sweat) each week. In addition, most adults need muscle-strengthening exercises on 2 or more days a week.  Silver Sneakers may be a benefit available to you. To determine eligibility, you may visit the website: www.silversneakers.com or contact program at [phone] Mon-Fri between 8AM-8PM.   . Maintain a healthy weight. The body mass index (BMI) is a screening tool to identify possible weight problems. It provides an estimate of body fat based on height and weight. Your health care provider can find your BMI and can help you achieve or maintain a healthy weight.   For adults 20 years and older: ? A BMI below 18.5 is considered underweight. ? A BMI of 18.5 to 24.9 is normal. ? A BMI of 25 to 29.9 is considered overweight. ? A BMI of 30 and above is considered obese.   . Maintain normal blood lipids and cholesterol levels by exercising and minimizing your intake of saturated fat. Eat a balanced diet with plenty of fruit and vegetables. Blood tests for lipids and cholesterol should begin  at age 34 and be repeated every 5 years. If your lipid or cholesterol levels are high, you are over 50, or you are at high risk for heart disease, you may need your cholesterol levels checked more frequently. Ongoing high lipid and cholesterol levels should be treated with medicines if diet and exercise are not working.  . If you smoke, find out from your health care provider how to quit. If you do not use tobacco, please do not start.  . If you choose to drink alcohol, please do not consume more than 2 drinks per day. One drink is  considered to be 12 ounces (355 mL) of beer, 5 ounces (148 mL) of wine, or 1.5 ounces (44 mL) of liquor.  . If you are 81-56 years old, ask your health care provider if you should take aspirin to prevent strokes.  . Use sunscreen. Apply sunscreen liberally and repeatedly throughout the day. You should seek shade when your shadow is shorter than you. Protect yourself by wearing long sleeves, pants, a wide-brimmed hat, and sunglasses year round, whenever you are outdoors.  . Once a month, do a whole body skin exam, using a mirror to look at the skin on your back. Tell your health care provider of new moles, moles that have irregular borders, moles that are larger than a pencil eraser, or moles that have changed in shape or color.

## 2017-04-07 NOTE — Progress Notes (Signed)
Pre visit review using our clinic review tool, if applicable. No additional management support is needed unless otherwise documented below in the visit note. 

## 2017-04-07 NOTE — Progress Notes (Signed)
PCP notes:   Health maintenance:  No gaps identified.  Abnormal screenings:   Hearing - failed  Hearing Screening   125Hz  250Hz  500Hz  1000Hz  2000Hz  3000Hz  4000Hz  6000Hz  8000Hz   Right ear:   0 40 40  40    Left ear:   40 0 40  40     Patient concerns:   None  Nurse concerns:  None  Next PCP appt:   04/11/17 @ 0930

## 2017-04-07 NOTE — Progress Notes (Signed)
Subjective:   VERONICA GUERRANT is a 72 y.o. male who presents for Medicare Annual/Subsequent preventive examination.  Review of Systems:  N/A Cardiac Risk Factors include: advanced age (>57men, >37 women);male gender;smoking/ tobacco exposure;dyslipidemia     Objective:    Vitals: BP 118/76 (BP Location: Right Arm, Patient Position: Sitting, Cuff Size: Normal)   Pulse (!) 58   Temp 97.8 F (36.6 C) (Oral)   Ht 5' 10.5" (1.791 m) Comment: no shoes  Wt 176 lb 8 oz (80.1 kg)   SpO2 98%   BMI 24.97 kg/m   Body mass index is 24.97 kg/m.  Advanced Directives 04/07/2017 08/16/2016 08/02/2016 04/06/2016 04/15/2015  Does Patient Have a Medical Advance Directive? Yes Yes Yes Yes Yes  Type of Paramedic of [place];Living will [place];Living will [place];Living will [place];Living will Living will  Does patient want to make changes to medical advance directive? - No - Patient declined - - -  Copy of [place] in Chart? Yes No - copy requested No - copy requested Yes -    Tobacco Social History   Tobacco Use  Smoking Status Current Every Day Smoker  . Packs/day: 1.00  . Years: 54.00  . Pack years: 54.00  . Types: Cigarettes  Smokeless Tobacco Never Used     Ready to quit: No Counseling given: No   Clinical Intake:  Pre-visit preparation completed: Yes  Pain : No/denies pain Pain Score: 0-No pain     Nutritional Status: BMI of 19-24  Normal Nutritional Risks: None Diabetes: No  How often do you need to have someone help you when you read instructions, pamphlets, or other written materials from your doctor or pharmacy?: 1 - Never What is the last grade level you completed in school?: Masters degrees (2)  Interpreter Needed?: No  Comments: pt lives with spouse Information entered by :: LPinson, LPN  Past Medical History:  Diagnosis Date  . BPH (benign prostatic  hypertrophy)    finasteride ([place])  . CAD (coronary artery disease) 05/2015   3v CAD by CT scan  . Cataract   . COPD (chronic obstructive pulmonary disease) ([place])    by screening CT scan  . ED (erectile dysfunction) of organic origin   . Elevated PSA    benign prostate biopsy 02/2012 ([place])  . Hyperlipemia    mild  . Sleep apnea    has cpap; uses occasionally.  uses nose strips   Past Surgical History:  Procedure Laterality Date  . CATARACT EXTRACTION W/PHACO Right 08/02/2016   Procedure: CATARACT EXTRACTION PHACO AND INTRAOCULAR LENS PLACEMENT (IOC);  Surgeon: Birder Robson, MD;  Location: [facility] ORS;  Service: Ophthalmology;  Laterality: Right;  [place] 01:23 AP% 18.0 CDE 14.92 Fluid pack lot # [id number]  . CATARACT EXTRACTION W/PHACO Left 08/16/2016   Procedure: CATARACT EXTRACTION PHACO AND INTRAOCULAR LENS PLACEMENT (IOC);  Surgeon: Birder Robson, MD;  Location: [facility] ORS;  Service: Ophthalmology;  Laterality: Left;  [place] 01:20 AP% 24.7 CDE 19.76 Fluid pack lot # [id number]  . COLONOSCOPY  01/2010   severe diverticulosis, 1 hyperplastic polyp, rpt 5 yrs [facility])  . COLONOSCOPY  04/2015   hyperplastic polyps, severe diverticulosis, int hem, rpt 10 yrs ([place])  . HAND SURGERY Left 1960  . VASECTOMY  1973   Family History  Problem Relation Age of Onset  . Colon cancer Mother 53  . Cancer Maternal Grandmother        ?  abd cancer  . CAD Neg Hx   . Stroke Neg Hx   . Diabetes Neg Hx    Social History   Socioeconomic History  . Marital status: Married    Spouse name: None  . Number of children: 3  . Years of education: None  . Highest education level: None  Social Needs  . Financial resource strain: None  . Food insecurity - worry: None  . Food insecurity - inability: None  . Transportation needs - medical: None  . Transportation needs - non-medical: None  Occupational History  . 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 Smoking status: Current Every Day Smoker    Packs/day: 1.00    Years: 54.00    Pack years: 54.00    Types: Cigarettes  . Smokeless tobacco: Never Used  Substance and Sexual Activity  . Alcohol use: Yes    Alcohol/week: 0.6 oz    Types: 1 Standard drinks or equivalent per week    Comment: beer on the weekends  . Drug use: No  . Sexual activity: None  Other Topics Concern  . None  Social History Narrative   Married, lives with wife   Children 3- out of home   [employer] - served in [employer]    Occupation: Engineer, civil (consulting) at [employer], retired June 2015   Activity: stays active at home, walking more, golfing   Diet: some water, fruits/vegetables daily    Outpatient Encounter Medications as of 04/07/2017  Medication Sig  . aspirin 81 MG tablet Take 81 mg by mouth 3 (three) times a week.  . Aspirin-Caffeine (BAYER BACK & BODY) 500-32.5 MG TABS Take 1 tablet by mouth daily as needed (pain). Reported on 04/29/2015  . atorvastatin (LIPITOR) 20 MG tablet TAKE 1 TABLET DAILY  . finasteride (PROSCAR) 5 MG tablet Take 5 mg by mouth at bedtime.   . Multiple Vitamins-Minerals (MULTIVITAMIN PO) Take 1 tablet by mouth at bedtime.    No facility-administered encounter medications on file as of 04/07/2017.     Activities of Daily Living In your present state of health, do you have any difficulty performing the following activities: 04/07/2017 08/02/2016  Hearing? N N  Vision? N N  Difficulty concentrating or making decisions? N N  Walking or climbing stairs? N N  Dressing or bathing? N N  Doing errands, shopping? N -  Preparing Food and eating ? N -  Using the Toilet? N -  In the past six months, have you accidently leaked urine? N -  Do you have problems with loss of bowel control? N -  Managing your Medications? N -  Managing your Finances? N -  Housekeeping or managing your Housekeeping? N -  Some recent data might be hidden    Patient Care Team: Ria Bush, MD as PCP - General (Family Medicine) Festus Aloe, MD as Consulting Physician (Urology)   Assessment:   This is a routine wellness examination for [place].   Hearing Screening   125Hz  250Hz  500Hz  1000Hz  2000Hz  3000Hz  4000Hz  6000Hz  8000Hz   Right ear:   0 40 40  40    Left ear:   40 0 40  40    Vision Screening Comments: Last vision exam in July 2018 with Dr. George Ina  Exercise Activities and Dietary recommendations Current Exercise Habits: The patient does not participate in regular exercise at present(golfing, yard work), Exercise limited by: None  identified  Goals    . DIET - REDUCE SUGAR INTAKE     Starting 04/21/2017, I will attempt to reduce intake of sugar by choosing healthy snacks.        Fall Risk Fall Risk  04/07/2017 04/06/2016 04/03/2015 03/31/2014 03/29/2013  Falls in the past year? No No No No No   Depression Screen PHQ 2/9 Scores 04/07/2017 04/06/2016 04/03/2015 03/31/2014  PHQ - 2 Score 0 0 0 0  PHQ- 9 Score 0 - - -    Cognitive Function MMSE - Mini Mental State Exam 04/07/2017 04/06/2016  Orientation to time 5 5  Orientation to Place 5 5  Registration 3 3  Attention/ Calculation 0 0  Recall 3 3  Language- name 2 objects 0 0  Language- repeat 1 1  Language- follow 3 step command 3 3  Language- read & follow direction 0 0  Write a sentence 0 0  Copy design 0 0  Total score 20 20     PLEASE NOTE: A Mini-Cog screen was completed. Maximum score is 20. A value of 0 denotes this part of [place] MMSE was not completed or the patient failed this part of the Mini-Cog screening.   Mini-Cog Screening Orientation to Time - Max 5 pts Orientation to Place - Max 5 pts Registration - Max 3 pts Recall - Max 3 pts Language Repeat - Max 1 pts Language Follow 3 Step Command - Max 3 pts     Immunization History  Administered Date(s) Administered  . Influenza, High Dose Seasonal PF 12/11/2014, 11/21/2016  . Influenza,inj,Quad PF,6+ Mos 12/15/2015  .  Influenza-Unspecified 11/21/2012, 12/22/2013  . Pneumococcal Conjugate-13 03/31/2014  . Pneumococcal Polysaccharide-23 12/23/2011  . Td 06/21/2005    Screening Tests Health Maintenance  Topic Date Due  . DTaP/Tdap/Td (1 - Tdap) 06/20/2025 (Originally 06/22/2005)  . TETANUS/TDAP  06/20/2025 (Originally 06/22/2015)  . COLONOSCOPY  04/28/2025  . INFLUENZA VACCINE  Completed  . Hepatitis C Screening  Completed  . PNA vac Low Risk Adult  Completed      Plan:     I have personally reviewed, addressed, and noted the following in the patient's chart:  A. Medical and social history B. Use of alcohol, tobacco or illicit drugs  C. Current medications and supplements D. Functional ability and status E.  Nutritional status F.  Physical activity G. Advance directives H. List of other physicians I.  Hospitalizations, surgeries, and ER visits in previous 12 months J.  [place] to include hearing, vision, cognitive, depression L. Referrals and appointments - none  In addition, I have reviewed and discussed with patient certain preventive protocols, quality metrics, and best practice recommendations. A written personalized care plan for preventive services as well as general preventive health recommendations were provided to patient.  See attached scanned questionnaire for additional information.   Signed,   Lindell Noe, MHA, BS, LPN Health Coach

## 2017-04-09 NOTE — Progress Notes (Signed)
I reviewed health advisor's note, was available for consultation, and agree with documentation and plan.  

## 2017-04-11 ENCOUNTER — Encounter: Payer: Self-pay | Admitting: Family Medicine

## 2017-04-11 ENCOUNTER — Ambulatory Visit (INDEPENDENT_AMBULATORY_CARE_PROVIDER_SITE_OTHER): Payer: Medicare Other | Admitting: Family Medicine

## 2017-04-11 VITALS — BP 130/68 | HR 67 | Temp 97.9°F | Ht 70.5 in | Wt 177.0 lb

## 2017-04-11 DIAGNOSIS — R972 Elevated prostate specific antigen [PSA]: Secondary | ICD-10-CM

## 2017-04-11 DIAGNOSIS — I700 Atherosclerosis of aorta: Secondary | ICD-10-CM

## 2017-04-11 DIAGNOSIS — J449 Chronic obstructive pulmonary disease, unspecified: Secondary | ICD-10-CM | POA: Diagnosis not present

## 2017-04-11 DIAGNOSIS — Z7189 Other specified counseling: Secondary | ICD-10-CM

## 2017-04-11 DIAGNOSIS — E785 Hyperlipidemia, unspecified: Secondary | ICD-10-CM

## 2017-04-11 DIAGNOSIS — F17200 Nicotine dependence, unspecified, uncomplicated: Secondary | ICD-10-CM | POA: Diagnosis not present

## 2017-04-11 DIAGNOSIS — I2510 Atherosclerotic heart disease of native coronary artery without angina pectoris: Secondary | ICD-10-CM

## 2017-04-11 DIAGNOSIS — G4733 Obstructive sleep apnea (adult) (pediatric): Secondary | ICD-10-CM

## 2017-04-11 DIAGNOSIS — N400 Enlarged prostate without lower urinary tract symptoms: Secondary | ICD-10-CM

## 2017-04-11 NOTE — Assessment & Plan Note (Signed)
Chronic, stable off medication. Continued smoker.

## 2017-04-11 NOTE — Assessment & Plan Note (Signed)
H/o this. Does not tolerate CPAP. Uses nasal strips.

## 2017-04-11 NOTE — Patient Instructions (Addendum)
If interested, check with pharmacy about new 2 shot shingles series (shingrix).  Keep working on quitting smoking. Let us know if you would like assistance in quitting.  You are doing well today. Return as needed or in 1 year for next medicare wellness visit with Katha Cabal and follow up.

## 2017-04-11 NOTE — Assessment & Plan Note (Signed)
Chronic, stable. Continue current regimen. The 10-year ASCVD risk score Mikey Bussing DC Brooke Bonito., et al., 2013) is: 23.5%   Values used to calculate the score:     Age: 72 years     Sex: Male     Is Non-Hispanic African American: No     Diabetic: No     Tobacco smoker: Yes     Systolic Blood Pressure: 284 mmHg     Is BP treated: No     HDL Cholesterol: 46.8 mg/dL     Total Cholesterol: 135 mg/dL

## 2017-04-11 NOTE — Assessment & Plan Note (Signed)
By imaging. On statin, aspirin.

## 2017-04-11 NOTE — Progress Notes (Signed)
BP 130/68 (BP Location: Left Arm, Patient Position: Sitting, Cuff Size: Normal)   Pulse 67   Temp 97.9 F (36.6 C) (Oral)   Ht 5' 10.5" (1.791 m)   Wt 177 lb (80.3 kg)   SpO2 94%   BMI 25.04 kg/m    CC: AMW f/u visit Subjective:    Patient ID: Richard Powell, male    DOB: 10-15-1945, 72 y.o.   MRN: [id number]  HPI: Richard Powell is a 72 y.o. male presenting on 04/11/2017 for Annual Exam (Pt 2.)   Saw Katha Cabal this week for medicare wellness visit. Note reviewed.   Cataract surgery bilaterally 08/2016 Changing pharmacies.   Preventative: COLONOSCOPY 04/2015 hyperplastic polyps, severe diverticulosis, int hem, rpt 10 yrs ([facility]) Prostate cancer screening - h/o elevated PSA in past. No fmhx prostate cancer. ++BPH sxs. Follows with Dr. Junious Silk yearly. Had prostate MRI recently - reassuring.  Lung cancer screening - receiving LRCT lung scan yearly - stable, mild emphysema. Last 11/2016.  Flu shot yearly Pneumovax - 2013. prevnar 2016 Td 2007 zostavax - declines. Did not have chicken pox.  shingrix - discussed Advanced directives: has living will at home. HCPOA would be wife then oldest son. Copy in chart. Seat belt use discussed Sunscreen use discussed. No changing moles on skin. using wide brimmed hat.  Smoking 1 ppd x 54 yrs, planning cutting down - currently 3/4 ppd  Alcohol on weekends   Married, lives with wife Children 3- out of home [employer] - served in air [employer]  Occupation: Engineer, civil (consulting) at [employer], retired June 2015 Activity: stays active at home, walking more, golfing Diet: some water, fruits/vegetables daily   Relevant past medical, surgical, family and social history reviewed and updated as indicated. Interim medical history since our last visit reviewed. Allergies and medications reviewed and updated. Outpatient Medications Prior to Visit  Medication Sig Dispense Refill  . aspirin 81 MG tablet Take 81 mg by mouth 3 (three) times a week.    .  Aspirin-Caffeine (BAYER BACK & BODY) 500-32.5 MG TABS Take 1 tablet by mouth daily as needed (pain). Reported on 04/29/2015    . atorvastatin (LIPITOR) 20 MG tablet TAKE 1 TABLET DAILY 90 tablet 0  . finasteride (PROSCAR) 5 MG tablet Take 5 mg by mouth at bedtime.     . Multiple Vitamins-Minerals (MULTIVITAMIN PO) Take 1 tablet by mouth at bedtime.      No facility-administered medications prior to visit.      Per HPI unless specifically indicated in ROS section below Review of Systems     Objective:    BP 130/68 (BP Location: Left Arm, Patient Position: Sitting, Cuff Size: Normal)   Pulse 67   Temp 97.9 F (36.6 C) (Oral)   Ht 5' 10.5" (1.791 m)   Wt 177 lb (80.3 kg)   SpO2 94%   BMI 25.04 kg/m   Wt Readings from Last 3 Encounters:  04/11/17 177 lb (80.3 kg)  04/07/17 176 lb 8 oz (80.1 kg)  08/16/16 180 lb (81.6 kg)    Physical Exam  Constitutional: He is oriented to person, place, and time. He appears well-developed and well-nourished. No distress.  HENT:  Head: Normocephalic and atraumatic.  Right Ear: Hearing, tympanic membrane, external ear and ear canal normal.  Left Ear: Hearing, tympanic membrane, external ear and ear canal normal.  Nose: Nose normal.  Mouth/Throat: Uvula is midline, oropharynx is clear and moist and mucous membranes are normal. No oropharyngeal exudate, posterior oropharyngeal edema or  posterior oropharyngeal erythema.  Eyes: Conjunctivae and EOM are normal. Pupils are equal, round, and reactive to light. No scleral icterus.  Neck: Normal range of motion. Neck supple. Carotid bruit is not present. No thyromegaly present.  Cardiovascular: Normal rate, regular rhythm, normal heart sounds and intact distal pulses.  No murmur heard. Pulses:      Radial pulses are 2+ on the right side, and 2+ on the left side.  Pulmonary/Chest: Effort normal and breath sounds normal. No respiratory distress. He has no wheezes. He has no rales.  Abdominal: Soft. Bowel  sounds are normal. He exhibits no distension and no mass. There is no tenderness. There is no rebound and no guarding.  Musculoskeletal: Normal range of motion. He exhibits no edema.  Lymphadenopathy:    He has no cervical adenopathy.  Neurological: He is alert and oriented to person, place, and time.  CN grossly intact, station and gait intact  Skin: Skin is warm and dry. No rash noted.  Psychiatric: He has a normal mood and affect. His behavior is normal. Judgment and thought content normal.  Nursing note and vitals reviewed.  Results for orders placed or performed in visit on 04/07/17  PSA  Result Value Ref Range   PSA 3.76 0.10 - 4.00 ng/mL  Comprehensive metabolic panel  Result Value Ref Range   Sodium 141 135 - 145 mEq/L   Potassium 4.9 3.5 - 5.1 mEq/L   Chloride 106 96 - 112 mEq/L   CO2 30 19 - 32 mEq/L   Glucose, Bld 94 70 - 99 mg/dL   BUN 21 6 - 23 mg/dL   Creatinine, Ser 0.90 0.40 - 1.50 mg/dL   Total Bilirubin 0.8 0.2 - 1.2 mg/dL   Alkaline Phosphatase 78 39 - 117 U/L   AST 20 0 - 37 U/L   ALT 18 0 - 53 U/L   Total Protein 6.8 6.0 - 8.3 g/dL   Albumin 4.3 3.5 - 5.2 g/dL   Calcium 9.3 8.4 - 10.5 mg/dL   GFR 87.65 >60.00 mL/min  Lipid panel  Result Value Ref Range   Cholesterol 135 0 - 200 mg/dL   Triglycerides 82.0 0.0 - 149.0 mg/dL   HDL 46.80 >39.00 mg/dL   VLDL 16.4 0.0 - 40.0 mg/dL   LDL Cholesterol 72 0 - 99 mg/dL   Total CHOL/HDL Ratio 3    NonHDL 88.47       Assessment & Plan:   Problem List Items Addressed This Visit    Advanced care planning/counseling discussion - Primary    Wife then son would be HCPOA. Received advanced directive, asked to scan. Did not receive HCPOA (03/2014)      Benign prostatic hyperplasia   CAD (coronary artery disease), native coronary artery    By imaging. On statin, aspirin.       COPD (chronic obstructive pulmonary disease) (HCC)    Chronic, stable off medication. Continued smoker.       ELEVATED PROSTATE  SPECIFIC ANTIGEN    Followed by urology. Recent reassuring prostate MRI. Will fax recent PSA attn urologist.       HLD (hyperlipidemia)    Chronic, stable. Continue current regimen. The 10-year ASCVD risk score Mikey Bussing DC Jr., et al., 2013) is: 23.5%   Values used to calculate the score:     Age: 45 years     Sex: Male     Is Non-Hispanic African American: No     Diabetic: No     Tobacco smoker: Yes  Systolic Blood Pressure: 616 mmHg     Is BP treated: No     HDL Cholesterol: 46.8 mg/dL     Total Cholesterol: 135 mg/dL       OSA (obstructive sleep apnea)    H/o this. Does not tolerate CPAP. Uses nasal strips.       Smoker    Continue to encourage cessation. Contemplative.  Undergoing lung cancer screening CT program.       Thoracic aorta atherosclerosis ([place])    By CT. Continue statin, aspiring.           No orders of the defined types were placed in this encounter.  No orders of the defined types were placed in this encounter.   Follow up plan: Return in about 1 year (around 04/11/2018) for medicare wellness visit, follow up visit.  Ria Bush, MD

## 2017-04-11 NOTE — Assessment & Plan Note (Addendum)
Wife then son would be HCPOA. Received advanced directive, asked to scan. Did not receive HCPOA (03/2014)

## 2017-04-11 NOTE — Assessment & Plan Note (Signed)
By CT. Continue statin, aspiring.

## 2017-04-11 NOTE — Assessment & Plan Note (Signed)
Continue to encourage cessation. Contemplative.  Undergoing lung cancer screening CT program.

## 2017-04-11 NOTE — Assessment & Plan Note (Addendum)
Followed by urology. Recent reassuring prostate MRI. Will fax recent PSA attn urologist.

## 2017-06-14 DIAGNOSIS — R972 Elevated prostate specific antigen [PSA]: Secondary | ICD-10-CM | POA: Diagnosis not present

## 2017-06-17 ENCOUNTER — Other Ambulatory Visit: Payer: Self-pay | Admitting: Family Medicine

## 2017-12-08 ENCOUNTER — Ambulatory Visit (INDEPENDENT_AMBULATORY_CARE_PROVIDER_SITE_OTHER)
Admission: RE | Admit: 2017-12-08 | Discharge: 2017-12-08 | Disposition: A | Discharge status: Home / Self Care | Payer: Medicare Other | Source: Ambulatory Visit | Attending: Acute Care | Admitting: Acute Care

## 2017-12-08 DIAGNOSIS — R972 Elevated prostate specific antigen [PSA]: Secondary | ICD-10-CM | POA: Diagnosis not present

## 2017-12-08 DIAGNOSIS — F17210 Nicotine dependence, cigarettes, uncomplicated: Secondary | ICD-10-CM | POA: Diagnosis not present

## 2017-12-08 DIAGNOSIS — Z122 Encounter for screening for malignant neoplasm of respiratory organs: Secondary | ICD-10-CM

## 2017-12-12 ENCOUNTER — Telehealth: Payer: Self-pay | Admitting: Acute Care

## 2017-12-12 DIAGNOSIS — Z87891 Personal history of nicotine dependence: Secondary | ICD-10-CM

## 2017-12-12 DIAGNOSIS — R351 Nocturia: Secondary | ICD-10-CM | POA: Diagnosis not present

## 2017-12-12 DIAGNOSIS — F17210 Nicotine dependence, cigarettes, uncomplicated: Principal | ICD-10-CM

## 2017-12-12 DIAGNOSIS — N5201 Erectile dysfunction due to arterial insufficiency: Secondary | ICD-10-CM | POA: Diagnosis not present

## 2017-12-12 DIAGNOSIS — R972 Elevated prostate specific antigen [PSA]: Secondary | ICD-10-CM | POA: Diagnosis not present

## 2017-12-12 DIAGNOSIS — Z122 Encounter for screening for malignant neoplasm of respiratory organs: Secondary | ICD-10-CM

## 2017-12-12 DIAGNOSIS — N401 Enlarged prostate with lower urinary tract symptoms: Secondary | ICD-10-CM | POA: Diagnosis not present

## 2017-12-13 NOTE — Telephone Encounter (Signed)
Routing to [place] for her to follow up on.

## 2017-12-14 NOTE — Telephone Encounter (Signed)
LMTC x 1  

## 2017-12-15 NOTE — Telephone Encounter (Signed)
Pt is calling back [phone]

## 2017-12-18 NOTE — Telephone Encounter (Signed)
LMTC x 1  

## 2017-12-21 NOTE — Telephone Encounter (Signed)
Pt informed of CT results per Sarah Groce, NP.  PT verbalized understanding.  Copy sent to PCP.  Order placed for 1 yr f/u CT.  

## 2018-03-15 ENCOUNTER — Other Ambulatory Visit: Payer: Self-pay | Admitting: Family Medicine

## 2018-04-11 ENCOUNTER — Ambulatory Visit (INDEPENDENT_AMBULATORY_CARE_PROVIDER_SITE_OTHER): Payer: Medicare Other

## 2018-04-11 ENCOUNTER — Ambulatory Visit: Payer: Medicare Other

## 2018-04-11 VITALS — BP 130/80 | HR 62 | Temp 98.1°F | Ht 71.5 in | Wt 172.2 lb

## 2018-04-11 DIAGNOSIS — Z0000 Encounter for general adult medical examination without abnormal findings: Secondary | ICD-10-CM | POA: Diagnosis not present

## 2018-04-11 DIAGNOSIS — E785 Hyperlipidemia, unspecified: Secondary | ICD-10-CM

## 2018-04-11 DIAGNOSIS — Z125 Encounter for screening for malignant neoplasm of prostate: Secondary | ICD-10-CM

## 2018-04-11 LAB — COMPREHENSIVE METABOLIC PANEL
ALBUMIN: 4.4 g/dL (ref 3.5–5.2)
ALT: 14 U/L (ref 0–53)
AST: 16 U/L (ref 0–37)
Alkaline Phosphatase: 85 U/L (ref 39–117)
BUN: 25 mg/dL — AB (ref 6–23)
CHLORIDE: 105 meq/L (ref 96–112)
CO2: 29 meq/L (ref 19–32)
CREATININE: 0.98 mg/dL (ref 0.40–1.50)
Calcium: 9.7 mg/dL (ref 8.4–10.5)
GFR: 74.54 mL/min (ref >60.00)
Glucose, Bld: 93 mg/dL (ref 70–99)
POTASSIUM: 4.7 meq/L (ref 3.5–5.1)
SODIUM: 142 meq/L (ref 135–145)
Total Bilirubin: 0.9 mg/dL (ref 0.2–1.2)
Total Protein: 7 g/dL (ref 6.0–8.3)

## 2018-04-11 LAB — LIPID PANEL
CHOL/HDL RATIO: 3
Cholesterol: 127 mg/dL (ref 0–200)
HDL: 47.5 mg/dL (ref >39.00)
LDL CALC: 65 mg/dL (ref 0–99)
NonHDL: 79.1
TRIGLYCERIDES: 70 mg/dL (ref 0.0–149.0)
VLDL: 14 mg/dL (ref 0.0–40.0)

## 2018-04-11 LAB — PSA, MEDICARE: PSA: 3.99 ng/ml (ref 0.10–4.00)

## 2018-04-11 NOTE — Patient Instructions (Addendum)
Richard Powell , Thank you for taking time to come for your Medicare Wellness Visit. I appreciate your ongoing commitment to your health goals. Please review the following plan we discussed and let me know if I can assist you in the future.   These are the goals we discussed: Goals    . Increase physical activity     Starting 04/11/18, I will continue to play golf for 12 hours week and to do yard work for 4 hours per week.        This is a list of the screening recommended for you and due dates:  Health Maintenance  Topic Date Due  . DTaP/Tdap/Td vaccine (1 - Tdap) 06/20/2025*  . Tetanus Vaccine  06/20/2025*  . Colon Cancer Screening  04/28/2025  . Flu Shot  Completed  .  Hepatitis C: One time screening is recommended by Center for Disease Control  (CDC) for  adults born from [birth year] through 1967.   Completed  . Pneumonia vaccines  Completed  *Topic was postponed. The date shown is not the original due date.   Preventive Care for Adults  A healthy lifestyle and preventive care can promote health and wellness. Preventive health guidelines for adults include the following key practices.  . A routine yearly physical is a good way to check with your health care provider about your health and preventive screening. It is a chance to share any concerns and updates on your health and to receive a thorough exam.  . Visit your dentist for a routine exam and preventive care every 6 months. Brush your teeth twice a day and floss once a day. Good oral hygiene prevents tooth decay and gum disease.  . The frequency of eye exams is based on your age, health, family medical history, use  of contact lenses, and other factors. Follow your health care provider's recommendations for frequency of eye exams.  . Eat a healthy diet. Foods like vegetables, fruits, whole grains, low-fat dairy products, and lean protein foods contain the nutrients you need without too many calories. Decrease your intake of foods  high in solid fats, added sugars, and salt. Eat the right amount of calories for you. Get information about a proper diet from your health care provider, if necessary.  . Regular physical exercise is one of the most important things you can do for your health. Most adults should get at least 150 minutes of moderate-intensity exercise (any activity that increases your heart rate and causes you to sweat) each week. In addition, most adults need muscle-strengthening exercises on 2 or more days a week.  Silver Sneakers may be a benefit available to you. To determine eligibility, you may visit the website: www.silversneakers.com or contact program at (365)849-6983 Mon-Fri between 8AM-8PM.   . Maintain a healthy weight. The body mass index (BMI) is a screening tool to identify possible weight problems. It provides an estimate of body fat based on height and weight. Your health care provider can find your BMI and can help you achieve or maintain a healthy weight.   For adults 20 years and older: ? A BMI below 18.5 is considered underweight. ? A BMI of 18.5 to 24.9 is normal. ? A BMI of 25 to 29.9 is considered overweight. ? A BMI of 30 and above is considered obese.   . Maintain normal blood lipids and cholesterol levels by exercising and minimizing your intake of saturated fat. Eat a balanced diet with plenty of fruit and vegetables. Blood tests  for lipids and cholesterol should begin at age 42 and be repeated every 5 years. If your lipid or cholesterol levels are high, you are over 50, or you are at high risk for heart disease, you may need your cholesterol levels checked more frequently. Ongoing high lipid and cholesterol levels should be treated with medicines if diet and exercise are not working.  . If you smoke, find out from your health care provider how to quit. If you do not use tobacco, please do not start.  . If you choose to drink alcohol, please do not consume more than 2 drinks per day.  One drink is considered to be 12 ounces (355 mL) of beer, 5 ounces (148 mL) of wine, or 1.5 ounces (44 mL) of liquor.  . If you are 21-64 years old, ask your health care provider if you should take aspirin to prevent strokes.  . Use sunscreen. Apply sunscreen liberally and repeatedly throughout the day. You should seek shade when your shadow is shorter than you. Protect yourself by wearing long sleeves, pants, a wide-brimmed hat, and sunglasses year round, whenever you are outdoors.  . Once a month, do a whole body skin exam, using a mirror to look at the skin on your back. Tell your health care provider of new moles, moles that have irregular borders, moles that are larger than a pencil eraser, or moles that have changed in shape or color.

## 2018-04-11 NOTE — Progress Notes (Signed)
Subjective:   Richard Powell is a 73 y.o. male who presents for Medicare Annual/Subsequent preventive examination.  Review of Systems:  N/A Cardiac Risk Factors include: advanced age (>71men, >42 women);male gender;smoking/ tobacco exposure;dyslipidemia     Objective:    Vitals: BP 130/80 (BP Location: Right Arm, Patient Position: Sitting, Cuff Size: Normal)   Pulse 62   Temp 98.1 F (36.7 C) (Oral)   Ht 5' 11.5" (1.816 m) Comment: shoes  Wt 172 lb 3.2 oz (78.1 kg)   SpO2 98%   BMI 23.68 kg/m   Body mass index is 23.68 kg/m.  Advanced Directives 04/11/2018 04/07/2017 08/16/2016 08/02/2016 04/06/2016 04/15/2015  Does Patient Have a Medical Advance Directive? Yes Yes Yes Yes Yes Yes  Type of Paramedic of [place];Living will [place];Living will [place];Living will [place];Living will [place];Living will Living will  Does patient want to make changes to medical advance directive? - - No - Patient declined - - -  Copy of [place] in Chart? Yes - validated most recent copy scanned in chart (See row information) Yes No - copy requested No - copy requested Yes -    Tobacco Social History   Tobacco Use  Smoking Status Current Every Day Smoker  . Packs/day: 1.00  . Years: 54.00  . Pack years: 54.00  . Types: Cigarettes  Smokeless Tobacco Never Used     Ready to quit: No Counseling given: No   Clinical Intake:  Pre-visit preparation completed: Yes  Pain : No/denies pain Pain Score: 3      Nutritional Status: BMI of 19-24  Normal Nutritional Risks: None Diabetes: No  How often do you need to have someone help you when you read instructions, pamphlets, or other written materials from your doctor or pharmacy?: 1 - Never What is the last grade level you completed in school?: Master degrees (2)  Interpreter Needed?: No  Comments: pt lives with  spouse Information entered by :: LPinson, LPN  Past Medical History:  Diagnosis Date  . BPH (benign prostatic hypertrophy)    finasteride ([place])  . CAD (coronary artery disease) 05/2015   3v CAD by CT scan  . Cataract   . COPD (chronic obstructive pulmonary disease) ([place])    by screening CT scan  . ED (erectile dysfunction) of organic origin   . Elevated PSA    benign prostate biopsy 02/2012 ([place])  . Hyperlipemia    mild  . Sleep apnea    has cpap; uses occasionally.  uses nose strips   Past Surgical History:  Procedure Laterality Date  . CATARACT EXTRACTION W/PHACO Right 08/02/2016   Procedure: CATARACT EXTRACTION PHACO AND INTRAOCULAR LENS PLACEMENT (IOC);  Surgeon: Birder Robson, MD;  Location: [facility] ORS;  Service: Ophthalmology;  Laterality: Right;  [place] 01:23 AP% 18.0 CDE 14.92 Fluid pack lot # [id number]  . CATARACT EXTRACTION W/PHACO Left 08/16/2016   Procedure: CATARACT EXTRACTION PHACO AND INTRAOCULAR LENS PLACEMENT (IOC);  Surgeon: Birder Robson, MD;  Location: [facility] ORS;  Service: Ophthalmology;  Laterality: Left;  [place] 01:20 AP% 24.7 CDE 19.76 Fluid pack lot # [id number]  . COLONOSCOPY  01/2010   severe diverticulosis, 1 hyperplastic polyp, rpt 5 yrs Sharlett Iles)  . COLONOSCOPY  04/2015   hyperplastic polyps, severe diverticulosis, int hem, rpt 10 yrs ([facility])  . HAND SURGERY Left 1960  . VASECTOMY  1973   Family History  Problem Relation Age of Onset  .  Colon cancer Mother 17  . Cancer Maternal Grandmother        ? abd cancer  . CAD Neg Hx   . Stroke Neg Hx   . Diabetes Neg Hx    Social History   Socioeconomic History  . Marital status: Married    Spouse name: Not on file  . Number of children: 3  . Years of education: Not on file  . Highest education level: Not on file  Occupational History  . 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 Financial resource strain: Not on file  . Food  insecurity:    Worry: Not on file    Inability: Not on file  . Transportation needs:    Medical: Not on file    Non-medical: Not on file  Tobacco Use  . Smoking status: Current Every Day Smoker    Packs/day: 1.00    Years: 54.00    Pack years: 54.00    Types: Cigarettes  . Smokeless tobacco: Never Used  Substance and Sexual Activity  . Alcohol use: Yes    Alcohol/week: 1.0 standard drinks    Types: 1 Cans of beer per week    Comment: beer on the weekends  . Drug use: No  . Sexual activity: Not on file  Lifestyle  . Physical activity:    Days per week: Not on file    Minutes per session: Not on file  . Stress: Not on file  Relationships  . Social connections:    Talks on phone: Not on file    Gets together: Not on file    Attends religious service: Not on file    Active member of club or organization: Not on file    Attends meetings of clubs or organizations: Not on file    Relationship status: Not on file  Other Topics Concern  . Not on file  Social History Narrative   Married, lives with wife   Children 3- out of home   [employer] - served in air [employer]    Occupation: Engineer, civil (consulting) at [employer], retired June 2015   Activity: stays active at home, walking more, golfing   Diet: some water, fruits/vegetables daily    Outpatient Encounter Medications as of 04/11/2018  Medication Sig  . aspirin 81 MG tablet Take 81 mg by mouth 3 (three) times a week.  . Aspirin-Caffeine (BAYER BACK & BODY) 500-32.5 MG TABS Take 1 tablet by mouth daily as needed (pain). Reported on 04/29/2015  . atorvastatin (LIPITOR) 20 MG tablet TAKE 1 TABLET DAILY  . finasteride (PROSCAR) 5 MG tablet Take 5 mg by mouth at bedtime.   . Multiple Vitamins-Minerals (MULTIVITAMIN PO) Take 1 tablet by mouth at bedtime.    No facility-administered encounter medications on file as of 04/11/2018.     Activities of Daily Living In your present state of health, do you have any difficulty performing the following  activities: 04/11/2018  Hearing? N  Vision? N  Difficulty concentrating or making decisions? N  Walking or climbing stairs? N  Dressing or bathing? N  Doing errands, shopping? N  Preparing Food and eating ? N  Using the Toilet? N  In the past six months, have you accidently leaked urine? N  Do you have problems with loss of bowel control? N  Managing your Medications? N  Managing your Finances? N  Housekeeping or managing your Housekeeping? N  Some  recent data might be hidden    Patient Care Team: Ria Bush, MD as PCP - General (Family Medicine) Festus Aloe, MD as Consulting Physician (Urology)   Assessment:   This is a routine wellness examination for Adyan.   Hearing Screening   125Hz  250Hz  500Hz  1000Hz  2000Hz  3000Hz  4000Hz  6000Hz  8000Hz   Right ear:   40 40 40  40    Left ear:   40 40 40  40    Vision Screening Comments: Vision exam in March 2019 with Dr. George Ina   Exercise Activities and Dietary recommendations Current Exercise Habits: Home exercise routine, Type of exercise: Other - see comments(golf 3 days per week; yard work), Time (Minutes): > 60, Frequency (Times/Week): 3, Weekly Exercise (Minutes/Week): 0, Intensity: Mild, Exercise limited by: None identified  Goals    . Increase physical activity     Starting 04/11/18, I will continue to play golf for 12 hours week and to do yard work for 4 hours per week.        Fall Risk Fall Risk  04/11/2018 04/07/2017 04/06/2016 04/03/2015 03/31/2014  Falls in the past year? 0 No No No No    Depression Screen PHQ 2/9 Scores 04/11/2018 04/07/2017 04/06/2016 04/03/2015  PHQ - 2 Score 0 0 0 0  PHQ- 9 Score 0 0 - -    Cognitive Function MMSE - Mini Mental State Exam 04/11/2018 04/07/2017 04/06/2016  Orientation to time 5 5 5   Orientation to Place 5 5 5   Registration 3 3 3   Attention/ Calculation 0 0 0  Recall 3 3 3   Language- name 2 objects 0 0 0  Language- repeat 1 1 1   Language- follow 3 step command 3 3 3     Language- read & follow direction 0 0 0  Write a sentence 0 0 0  Copy design 0 0 0  Total score 20 20 20        PLEASE NOTE: A Mini-Cog screen was completed. Maximum score is 20. A value of 0 denotes this part of [place] MMSE was not completed or the patient failed this part of the Mini-Cog screening.   Mini-Cog Screening Orientation to Time - Max 5 pts Orientation to Place - Max 5 pts Registration - Max 3 pts Recall - Max 3 pts Language Repeat - Max 1 pts Language Follow 3 Step Command - Max 3 pts   Immunization History  Administered Date(s) Administered  . Influenza, High Dose Seasonal PF 12/11/2014, 11/21/2016, 12/01/2017  . Influenza,inj,Quad PF,6+ Mos 12/15/2015  . Influenza-Unspecified 11/21/2012, 12/22/2013  . Pneumococcal Conjugate-13 03/31/2014  . Pneumococcal Polysaccharide-23 12/23/2011  . Td 06/21/2005    Screening Tests Health Maintenance  Topic Date Due  . DTaP/Tdap/Td (1 - Tdap) 06/20/2025 (Originally 02/25/1956)  . TETANUS/TDAP  06/20/2025 (Originally 06/22/2015)  . COLONOSCOPY  04/28/2025  . INFLUENZA VACCINE  Completed  . Hepatitis C Screening  Completed  . PNA vac Low Risk Adult  Completed     Plan:   I have personally reviewed, addressed, and noted the following in the patient's chart:  A. Medical and social history B. Use of alcohol, tobacco or illicit drugs  C. Current medications and supplements D. Functional ability and status E.  Nutritional status F.  Physical activity G. Advance directives H. List of other physicians I.  Hospitalizations, surgeries, and ER visits in previous 12 months J.  [place] to include hearing, vision, cognitive, depression L. Referrals and appointments - none  In addition, I have reviewed and  discussed with patient certain preventive protocols, quality metrics, and best practice recommendations. A written personalized care plan for preventive services as well as general preventive health recommendations  were provided to patient.  See attached scanned questionnaire for additional information.   Signed,   Lindell Noe, MHA, BS, LPN Health Coach

## 2018-04-11 NOTE — Progress Notes (Signed)
PCP notes:   Health maintenance:  No gaps identified.   Abnormal screenings:   None  Patient concerns:   None  Nurse concerns:  None  Next PCP appt:   04/13/18 @ 1030

## 2018-04-12 NOTE — Progress Notes (Signed)
   Subjective:    Patient ID: Richard Powell, male    DOB: [birth year], 73 y.o.   MRN: [id number]  HPI  I reviewed health advisor's note, was available for consultation, and agree with documentation and plan.   Review of Systems     Objective:   Physical Exam         Assessment & Plan:

## 2018-04-13 ENCOUNTER — Ambulatory Visit (INDEPENDENT_AMBULATORY_CARE_PROVIDER_SITE_OTHER): Payer: Medicare Other | Admitting: Family Medicine

## 2018-04-13 ENCOUNTER — Encounter: Payer: Self-pay | Admitting: Family Medicine

## 2018-04-13 VITALS — BP 128/74 | HR 64 | Temp 97.6°F | Ht 70.5 in | Wt 175.2 lb

## 2018-04-13 DIAGNOSIS — J449 Chronic obstructive pulmonary disease, unspecified: Secondary | ICD-10-CM

## 2018-04-13 DIAGNOSIS — I700 Atherosclerosis of aorta: Secondary | ICD-10-CM | POA: Diagnosis not present

## 2018-04-13 DIAGNOSIS — E785 Hyperlipidemia, unspecified: Secondary | ICD-10-CM

## 2018-04-13 DIAGNOSIS — L570 Actinic keratosis: Secondary | ICD-10-CM | POA: Diagnosis not present

## 2018-04-13 DIAGNOSIS — F17200 Nicotine dependence, unspecified, uncomplicated: Secondary | ICD-10-CM

## 2018-04-13 DIAGNOSIS — N400 Enlarged prostate without lower urinary tract symptoms: Secondary | ICD-10-CM | POA: Diagnosis not present

## 2018-04-13 DIAGNOSIS — R972 Elevated prostate specific antigen [PSA]: Secondary | ICD-10-CM | POA: Diagnosis not present

## 2018-04-13 NOTE — Assessment & Plan Note (Signed)
Chronic, stable. Continue current regimen. The ASCVD Risk score (Goff DC Jr., et al., 2013) failed to calculate for the following reasons:   The valid total cholesterol range is 130 to 320 mg/dL  

## 2018-04-13 NOTE — Assessment & Plan Note (Signed)
Continue statin. 

## 2018-04-13 NOTE — Assessment & Plan Note (Signed)
Discussed with patient, offered derm eval, declines for now.

## 2018-04-13 NOTE — Patient Instructions (Addendum)
Sign release for latest [facility] note 2019 Junious Silk) If interested, check with pharmacy about new 2 shot shingles series (shingrix).  You are doing well today. Continue current medicines.  Let me know if you notice any changing skin spots or if you'd like dermatology evaluation for precancers of skin of arms.  Return as needed or in 1 year for next physical.  Health Maintenance After Age 75 After age 29, you are at a higher risk for certain long-term diseases and infections as well as injuries from falls. Falls are a major cause of broken bones and head injuries in people who are older than age 55. Getting regular preventive care can help to keep you healthy and well. Preventive care includes getting regular testing and making lifestyle changes as recommended by your health care provider. Talk with your health care provider about:  Which screenings and tests you should have. A screening is a test that checks for a disease when you have no symptoms.  A diet and exercise plan that is right for you. What should I know about screenings and tests to prevent falls? Screening and testing are the best ways to find a health problem early. Early diagnosis and treatment give you the best chance of managing medical conditions that are common after age 39. Certain conditions and lifestyle choices may make you more likely to have a fall. Your health care provider may recommend:  Regular vision checks. Poor vision and conditions such as cataracts can make you more likely to have a fall. If you wear glasses, make sure to get your prescription updated if your vision changes.  Medicine review. Work with your health care provider to regularly review all of the medicines you are taking, including over-the-counter medicines. Ask your health care provider about any side effects that may make you more likely to have a fall. Tell your health care provider if any medicines that you take make you feel dizzy or  sleepy.  Osteoporosis screening. Osteoporosis is a condition that causes the bones to get weaker. This can make the bones weak and cause them to break more easily.  Blood pressure screening. Blood pressure changes and medicines to control blood pressure can make you feel dizzy.  Strength and balance checks. Your health care provider may recommend certain tests to check your strength and balance while standing, walking, or changing positions.  Foot health exam. Foot pain and numbness, as well as not wearing proper footwear, can make you more likely to have a fall.  Depression screening. You may be more likely to have a fall if you have a fear of falling, feel emotionally low, or feel unable to do activities that you used to do.  Alcohol use screening. Using too much alcohol can affect your balance and may make you more likely to have a fall. What actions can I take to lower my risk of falls? General instructions  Talk with your health care provider about your risks for falling. Tell your health care provider if: ? You fall. Be sure to tell your health care provider about all falls, even ones that seem minor. ? You feel dizzy, sleepy, or off-balance.  Take over-the-counter and prescription medicines only as told by your health care provider. These include any supplements.  Eat a healthy diet and maintain a healthy weight. A healthy diet includes low-fat dairy products, low-fat (lean) meats, and fiber from whole grains, beans, and lots of fruits and vegetables. Home safety  Remove any tripping hazards,  such as rugs, cords, and clutter.  Install safety equipment such as grab bars in bathrooms and safety rails on stairs.  Keep rooms and walkways well-lit. Activity   Follow a regular exercise program to stay fit. This will help you maintain your balance. Ask your health care provider what types of exercise are appropriate for you.  If you need a cane or walker, use it as recommended by  your health care provider.  Wear supportive shoes that have nonskid soles. Lifestyle  Do not drink alcohol if your health care provider tells you not to drink.  If you drink alcohol, limit how much you have: ? 0-1 drink a day for women. ? 0-2 drinks a day for men.  Be aware of how much alcohol is in your drink. In the [place] one drink equals one typical bottle of beer (12 oz), one-half glass of wine (5 oz), or one shot of hard liquor (1 oz).  Do not use any products that contain nicotine or tobacco, such as cigarettes and e-cigarettes. If you need help quitting, ask your health care provider. Summary  Having a healthy lifestyle and getting preventive care can help to protect your health and wellness after age 74.  Screening and testing are the best way to find a health problem early and help you avoid having a fall. Early diagnosis and treatment give you the best chance for managing medical conditions that are more common for people who are older than age 36.  Falls are a major cause of broken bones and head injuries in people who are older than age 59. Take precautions to prevent a fall at home.  Work with your health care provider to learn what changes you can make to improve your health and wellness and to prevent falls. This information is not intended to replace advice given to you by your health care provider. Make sure you discuss any questions you have with your health care provider. Document Released: 12/21/2016 Document Revised: 12/21/2016 Document Reviewed: 12/21/2016 Elsevier Interactive Patient Education  2019 [employer].

## 2018-04-13 NOTE — Progress Notes (Signed)
BP 128/74 (BP Location: Left Arm, Patient Position: Sitting, Cuff Size: Normal)   Pulse 64   Temp 97.6 F (36.4 C) (Oral)   Ht 5' 10.5" (1.791 m)   Wt 175 lb 4 oz (79.5 kg)   SpO2 98%   BMI 24.79 kg/m    CC: AMW f/u visit Subjective:    Patient ID: Richard Powell, male    DOB: 07-10-1945, 73 y.o.   MRN: [id number]  HPI: Richard Powell is a 73 y.o. male presenting on 04/13/2018 for Annual Exam (Pt 2.)   Saw Katha Cabal this week for medicare wellness visit. Note reviewed.   Preventative: COLONOSCOPY 04/2015 hyperplastic polyps, severe diverticulosis, int hem, rpt 10 yrs ([place]) Prostate cancer screening - h/o elevated PSA in past. No fmhx prostate cancer. ++BPH sxs. Follows with Dr. Junious Silk yearly. Had reassuring prostate MRI.  Lung cancer screening -receivingLRCTlung scan yearly - stable. Last 11/2017.  Flu shot yearly Pneumovax - 2013. prevnar 2016 Td 2007 zostavax -declines. Did not have chicken pox. shingrix - discussed  Advanced directives: has living will at home. HCPOA would be wife then oldest son. Copy in chart  Seat belt use discussed  Sunscreen use discussed. No changing moles on skin.Using wide brimmed hat  Alcohol occasionally on weekends Smoking 1 ppd x 54 yrs - currently 3/4 ppd  Dentist q6 mo Eye exam yearly (significant benefit from cataract surgery 07/2016)  Married, lives with wife  Children 3- out of home  [employer] - served in [employer] Occupation: Engineer, civil (consulting) at [employer], retired June 2015 Activity: stays active in the yard, walking more, golfing 3x/wk Diet: some water, fruits/vegetables daily      Relevant past medical, surgical, family and social history reviewed and updated as indicated. Interim medical history since our last visit reviewed. Allergies and medications reviewed and updated. Outpatient Medications Prior to Visit  Medication Sig Dispense Refill  . aspirin 81 MG tablet Take 81 mg by mouth 3 (three) times a week.    .  Aspirin-Caffeine (BAYER BACK & BODY) 500-32.5 MG TABS Take 1 tablet by mouth daily as needed (pain). Reported on 04/29/2015    . atorvastatin (LIPITOR) 20 MG tablet TAKE 1 TABLET DAILY 90 tablet 0  . finasteride (PROSCAR) 5 MG tablet Take 5 mg by mouth at bedtime.     . Multiple Vitamins-Minerals (MULTIVITAMIN PO) Take 1 tablet by mouth at bedtime.      No facility-administered medications prior to visit.      Per HPI unless specifically indicated in ROS section below Review of Systems Objective:    BP 128/74 (BP Location: Left Arm, Patient Position: Sitting, Cuff Size: Normal)   Pulse 64   Temp 97.6 F (36.4 C) (Oral)   Ht 5' 10.5" (1.791 m)   Wt 175 lb 4 oz (79.5 kg)   SpO2 98%   BMI 24.79 kg/m   Wt Readings from Last 3 Encounters:  04/13/18 175 lb 4 oz (79.5 kg)  04/11/18 172 lb 3.2 oz (78.1 kg)  04/11/17 177 lb (80.3 kg)    Physical Exam Vitals signs and nursing note reviewed.  Constitutional:      General: He is not in acute distress.    Appearance: Normal appearance. He is well-developed.  HENT:     Head: Normocephalic and atraumatic.     Right Ear: Hearing, tympanic membrane, ear canal and external ear normal.     Left Ear: Hearing, tympanic membrane, ear canal and external ear normal.  Nose: Nose normal.     Mouth/Throat:     Mouth: Mucous membranes are moist.     Pharynx: Oropharynx is clear. Uvula midline. No oropharyngeal exudate or posterior oropharyngeal erythema.  Eyes:     General: No scleral icterus.    Conjunctiva/sclera: Conjunctivae normal.     Pupils: Pupils are equal, round, and reactive to light.  Neck:     Musculoskeletal: Normal range of motion and neck supple.     Vascular: No carotid bruit.  Cardiovascular:     Rate and Rhythm: Normal rate and regular rhythm.     Pulses: Normal pulses.          Radial pulses are 2+ on the right side and 2+ on the left side.     Heart sounds: Normal heart sounds. No murmur.  Pulmonary:     Effort:  Pulmonary effort is normal. No respiratory distress.     Breath sounds: Normal breath sounds. No wheezing, rhonchi or rales.  Abdominal:     General: Bowel sounds are normal. There is no distension.     Palpations: Abdomen is soft. There is no mass.     Tenderness: There is no abdominal tenderness. There is no guarding or rebound.  Musculoskeletal: Normal range of motion.  Lymphadenopathy:     Cervical: No cervical adenopathy.  Skin:    General: Skin is warm and dry.     Findings: No rash.     Comments: Scaly spots to skin of forearms and hands  Neurological:     Mental Status: He is alert and oriented to person, place, and time.     Comments: CN grossly intact, station and gait intact  Psychiatric:        Behavior: Behavior normal.        Thought Content: Thought content normal.        Judgment: Judgment normal.       Results for orders placed or performed in visit on 04/11/18  PSA, Medicare ([place])  Result Value Ref Range   PSA 3.99 0.10 - 4.00 ng/ml  Comprehensive metabolic panel  Result Value Ref Range   Sodium 142 135 - 145 mEq/L   Potassium 4.7 3.5 - 5.1 mEq/L   Chloride 105 96 - 112 mEq/L   CO2 29 19 - 32 mEq/L   Glucose, Bld 93 70 - 99 mg/dL   BUN 25 (H) 6 - 23 mg/dL   Creatinine, Ser 0.98 0.40 - 1.50 mg/dL   Total Bilirubin 0.9 0.2 - 1.2 mg/dL   Alkaline Phosphatase 85 39 - 117 U/L   AST 16 0 - 37 U/L   ALT 14 0 - 53 U/L   Total Protein 7.0 6.0 - 8.3 g/dL   Albumin 4.4 3.5 - 5.2 g/dL   Calcium 9.7 8.4 - 10.5 mg/dL   GFR 74.54 >60.00 mL/min  Lipid Panel  Result Value Ref Range   Cholesterol 127 0 - 200 mg/dL   Triglycerides 70.0 0.0 - 149.0 mg/dL   HDL 47.50 >39.00 mg/dL   VLDL 14.0 0.0 - 40.0 mg/dL   LDL Cholesterol 65 0 - 99 mg/dL   Total CHOL/HDL Ratio 3    NonHDL 79.10    Assessment & Plan:   Problem List Items Addressed This Visit    Thoracic aorta atherosclerosis ([place])    Continue statin.       Smoker    Continue to encourage cessation.  Contemplative. Continue lung cancer screening CT program.  HLD (hyperlipidemia) - Primary    Chronic, stable. Continue current regimen  The ASCVD Risk score Mikey Bussing DC Jr., et al., 2013) failed to calculate for the following reasons:   The valid total cholesterol range is 130 to 320 mg/dL       ELEVATED PROSTATE SPECIFIC ANTIGEN    Will route today's levels to urology.      COPD (chronic obstructive pulmonary disease) ([place])    By CT but patient asymptomatic. Continued smoker. Encouraged cessation. Consider spirometry.       Benign prostatic hyperplasia    Regularly followed by urology, on proscar. PSA continues trending up but overall stable.       AK (actinic keratosis)    Discussed with patient, offered derm eval, declines for now.           No orders of the defined types were placed in this encounter.  No orders of the defined types were placed in this encounter.   Patient instructions: Sign release for latest [facility] note 2019 Junious Silk) If interested, check with pharmacy about new 2 shot shingles series (shingrix).  You are doing well today. Continue current medicines.  Let me know if you notice any changing skin spots or if you'd like dermatology evaluation for precancers of skin of arms.  Return as needed or in 1 year for next physical.   Follow up plan: Return in about 1 year (around 04/14/2019), or if symptoms worsen or fail to improve, for medicare wellness visit.  Ria Bush, MD

## 2018-04-13 NOTE — Assessment & Plan Note (Addendum)
Regularly followed by urology, on proscar. PSA continues trending up but overall stable.  I have requested latest urology note.

## 2018-04-13 NOTE — Assessment & Plan Note (Signed)
Will route today's levels to urology.

## 2018-04-13 NOTE — Assessment & Plan Note (Signed)
Continue to encourage cessation. Contemplative. Continue lung cancer screening CT program.

## 2018-04-13 NOTE — Assessment & Plan Note (Addendum)
By CT but patient asymptomatic. Continued smoker. Encouraged cessation. Consider spirometry.

## 2018-05-04 DIAGNOSIS — H4312 Vitreous hemorrhage, left eye: Secondary | ICD-10-CM | POA: Diagnosis not present

## 2018-05-14 DIAGNOSIS — R972 Elevated prostate specific antigen [PSA]: Secondary | ICD-10-CM | POA: Diagnosis not present

## 2018-06-13 ENCOUNTER — Other Ambulatory Visit: Payer: Self-pay | Admitting: Family Medicine

## 2018-06-13 NOTE — Telephone Encounter (Signed)
Patient last seen for hyperlipidemia on 04/13/18.  Not due for recheck for 1 year. Will give refills to carry through until next due follow up.

## 2018-07-25 DIAGNOSIS — N429 Disorder of prostate, unspecified: Secondary | ICD-10-CM | POA: Diagnosis not present

## 2018-07-25 DIAGNOSIS — R972 Elevated prostate specific antigen [PSA]: Secondary | ICD-10-CM | POA: Diagnosis not present

## 2018-12-19 ENCOUNTER — Other Ambulatory Visit: Payer: Self-pay | Admitting: *Deleted

## 2018-12-19 ENCOUNTER — Telehealth: Payer: Self-pay | Admitting: Acute Care

## 2018-12-19 DIAGNOSIS — Z122 Encounter for screening for malignant neoplasm of respiratory organs: Secondary | ICD-10-CM

## 2018-12-19 DIAGNOSIS — F17210 Nicotine dependence, cigarettes, uncomplicated: Secondary | ICD-10-CM

## 2018-12-19 DIAGNOSIS — Z87891 Personal history of nicotine dependence: Secondary | ICD-10-CM

## 2018-12-19 NOTE — Telephone Encounter (Signed)
Richard Powell, it looks like pt is returning your call.

## 2018-12-19 NOTE — Telephone Encounter (Signed)
Patient is scheduled at [place] location on 12/24/2018 @ 12:50pm Patient is aware of the appt and location

## 2018-12-24 ENCOUNTER — Ambulatory Visit
Admission: RE | Admit: 2018-12-24 | Discharge: 2018-12-24 | Disposition: A | Discharge status: Home / Self Care | Payer: Medicare Other | Source: Ambulatory Visit | Attending: Acute Care | Admitting: Acute Care

## 2018-12-24 DIAGNOSIS — Z122 Encounter for screening for malignant neoplasm of respiratory organs: Secondary | ICD-10-CM

## 2018-12-24 DIAGNOSIS — Z87891 Personal history of nicotine dependence: Secondary | ICD-10-CM

## 2018-12-24 DIAGNOSIS — F17210 Nicotine dependence, cigarettes, uncomplicated: Secondary | ICD-10-CM

## 2018-12-27 ENCOUNTER — Inpatient Hospital Stay: Admission: RE | Admit: 2018-12-27 | Payer: Medicare Other | Source: Ambulatory Visit

## 2019-01-03 ENCOUNTER — Other Ambulatory Visit: Payer: Self-pay | Admitting: *Deleted

## 2019-01-03 DIAGNOSIS — F17210 Nicotine dependence, cigarettes, uncomplicated: Secondary | ICD-10-CM

## 2019-01-03 DIAGNOSIS — Z122 Encounter for screening for malignant neoplasm of respiratory organs: Secondary | ICD-10-CM

## 2019-01-03 DIAGNOSIS — Z87891 Personal history of nicotine dependence: Secondary | ICD-10-CM

## 2019-01-24 DIAGNOSIS — R972 Elevated prostate specific antigen [PSA]: Secondary | ICD-10-CM | POA: Diagnosis not present

## 2019-01-24 DIAGNOSIS — N5201 Erectile dysfunction due to arterial insufficiency: Secondary | ICD-10-CM | POA: Diagnosis not present

## 2019-01-24 DIAGNOSIS — N401 Enlarged prostate with lower urinary tract symptoms: Secondary | ICD-10-CM | POA: Diagnosis not present

## 2019-01-24 DIAGNOSIS — R351 Nocturia: Secondary | ICD-10-CM | POA: Diagnosis not present

## 2019-03-10 ENCOUNTER — Other Ambulatory Visit: Payer: Self-pay | Admitting: Family Medicine

## 2019-04-06 ENCOUNTER — Ambulatory Visit: Payer: Medicare Other | Attending: Internal Medicine

## 2019-04-06 DIAGNOSIS — Z23 Encounter for immunization: Secondary | ICD-10-CM

## 2019-04-06 NOTE — Progress Notes (Signed)
   Covid-19 Vaccination Clinic  Name:  Richard Powell    MRN: [id number] DOB: 06/04/1945  04/06/2019  [name] was observed post Covid-19 immunization for 15 minutes without incidence. He was provided with Vaccine Information Sheet and instruction to access the V-Safe system.   [name] was instructed to call 911 with any severe reactions post vaccine: Marland Kitchen Difficulty breathing  . Swelling of your face and throat  . A fast heartbeat  . A bad rash all over your body  . Dizziness and weakness    Immunizations Administered    Name Date Dose VIS Date Route   Pfizer COVID-19 Vaccine 04/06/2019  9:10 AM 0.3 mL 02/01/2019 Intramuscular   Manufacturer: [place]   Lot: [id number]   [place]: [id number]

## 2019-04-22 ENCOUNTER — Other Ambulatory Visit: Payer: Self-pay

## 2019-04-22 ENCOUNTER — Ambulatory Visit (INDEPENDENT_AMBULATORY_CARE_PROVIDER_SITE_OTHER): Payer: Medicare Other

## 2019-04-22 VITALS — Wt 172.0 lb

## 2019-04-22 DIAGNOSIS — Z0000 Encounter for general adult medical examination without abnormal findings: Secondary | ICD-10-CM | POA: Diagnosis not present

## 2019-04-22 NOTE — Patient Instructions (Signed)
Richard Powell , Thank you for taking time to come for your Medicare Wellness Visit. I appreciate your ongoing commitment to your health goals. Please review the following plan we discussed and let me know if I can assist you in the future.   Screening recommendations/referrals: Colonoscopy: Up to date, completed 04/29/2015 Recommended yearly ophthalmology/optometry visit for glaucoma screening and checkup Recommended yearly dental visit for hygiene and checkup  Vaccinations: Influenza vaccine: Up to date, completed 11/26/2018 Pneumococcal vaccine: Completed series Tdap vaccine: decline Shingles vaccine: discussed    Advanced directives: Please bring a copy of your POA (Power of Attorney) and/or Living Will to your next appointment.  Conditions/risks identified: hyperlipidemia  Next appointment: 04/26/2019 @ 3 pm   Preventive Care 65 Years and Older, Male Preventive care refers to lifestyle choices and visits with your health care provider that can promote health and wellness. What does preventive care include?  A yearly physical exam. This is also called an annual well check.  Dental exams once or twice a year.  Routine eye exams. Ask your health care provider how often you should have your eyes checked.  Personal lifestyle choices, including:  Daily care of your teeth and gums.  Regular physical activity.  Eating a healthy diet.  Avoiding tobacco and drug use.  Limiting alcohol use.  Practicing safe sex.  Taking low doses of aspirin every day.  Taking vitamin and mineral supplements as recommended by your health care provider. What happens during an annual well check? The services and screenings done by your health care provider during your annual well check will depend on your age, overall health, lifestyle risk factors, and family history of disease. Counseling  Your health care provider may ask you questions about your:  Alcohol use.  Tobacco use.  Drug  use.  Emotional well-being.  Home and relationship well-being.  Sexual activity.  Eating habits.  History of falls.  Memory and ability to understand (cognition).  Work and work Statistician. Screening  You may have the following tests or measurements:  Height, weight, and BMI.  Blood pressure.  Lipid and cholesterol levels. These may be checked every 5 years, or more frequently if you are over 76 years old.  Skin check.  Lung cancer screening. You may have this screening every year starting at age 62 if you have a 30-pack-year history of smoking and currently smoke or have quit within the past 15 years.  Fecal occult blood test (FOBT) of the stool. You may have this test every year starting at age 76.  Flexible sigmoidoscopy or colonoscopy. You may have a sigmoidoscopy every 5 years or a colonoscopy every 10 years starting at age 76.  Prostate cancer screening. Recommendations will vary depending on your family history and other risks.  Hepatitis C blood test.  Hepatitis B blood test.  Sexually transmitted disease (STD) testing.  Diabetes screening. This is done by checking your blood sugar (glucose) after you have not eaten for a while (fasting). You may have this done every 1-3 years.  Abdominal aortic aneurysm (AAA) screening. You may need this if you are a current or former smoker.  Osteoporosis. You may be screened starting at age 50 if you are at high risk. Talk with your health care provider about your test results, treatment options, and if necessary, the need for more tests. Vaccines  Your health care provider may recommend certain vaccines, such as:  Influenza vaccine. This is recommended every year.  Tetanus, diphtheria, and acellular pertussis (Tdap,  Td) vaccine. You may need a Td booster every 10 years.  Zoster vaccine. You may need this after age 63.  Pneumococcal 13-valent conjugate (PCV13) vaccine. One dose is recommended after age  25.  Pneumococcal polysaccharide (PPSV23) vaccine. One dose is recommended after age 35. Talk to your health care provider about which screenings and vaccines you need and how often you need them. This information is not intended to replace advice given to you by your health care provider. Make sure you discuss any questions you have with your health care provider. Document Released: 03/06/2015 Document Revised: 10/28/2015 Document Reviewed: 12/09/2014 Elsevier Interactive Patient Education  2017 [place] Prevention in the Home Falls can cause injuries. They can happen to people of all ages. There are many things you can do to make your home safe and to help prevent falls. What can I do on the outside of my home?  Regularly fix the edges of walkways and driveways and fix any cracks.  Remove anything that might make you trip as you walk through a door, such as a raised step or threshold.  Trim any bushes or trees on the path to your home.  Use bright outdoor lighting.  Clear any walking paths of anything that might make someone trip, such as rocks or tools.  Regularly check to see if handrails are loose or broken. Make sure that both sides of any steps have handrails.  Any raised decks and porches should have guardrails on the edges.  Have any leaves, snow, or ice cleared regularly.  Use sand or salt on walking paths during winter.  Clean up any spills in your garage right away. This includes oil or grease spills. What can I do in the bathroom?  Use night lights.  Install grab bars by the toilet and in the tub and shower. Do not use towel bars as grab bars.  Use non-skid mats or decals in the tub or shower.  If you need to sit down in the shower, use a plastic, non-slip stool.  Keep the floor dry. Clean up any water that spills on the floor as soon as it happens.  Remove soap buildup in the tub or shower regularly.  Attach bath mats securely with double-sided  non-slip rug tape.  Do not have throw rugs and other things on the floor that can make you trip. What can I do in the bedroom?  Use night lights.  Make sure that you have a light by your bed that is easy to reach.  Do not use any sheets or blankets that are too big for your bed. They should not hang down onto the floor.  Have a firm chair that has side arms. You can use this for support while you get dressed.  Do not have throw rugs and other things on the floor that can make you trip. What can I do in the kitchen?  Clean up any spills right away.  Avoid walking on wet floors.  Keep items that you use a lot in easy-to-reach places.  If you need to reach something above you, use a strong step stool that has a grab bar.  Keep electrical cords out of the way.  Do not use floor polish or wax that makes floors slippery. If you must use wax, use non-skid floor wax.  Do not have throw rugs and other things on the floor that can make you trip. What can I do with my stairs?  Do not  leave any items on the stairs.  Make sure that there are handrails on both sides of the stairs and use them. Fix handrails that are broken or loose. Make sure that handrails are as long as the stairways.  Check any carpeting to make sure that it is firmly attached to the stairs. Fix any carpet that is loose or worn.  Avoid having throw rugs at the top or bottom of the stairs. If you do have throw rugs, attach them to the floor with carpet tape.  Make sure that you have a light switch at the top of the stairs and the bottom of the stairs. If you do not have them, ask someone to add them for you. What else can I do to help prevent falls?  Wear shoes that:  Do not have high heels.  Have rubber bottoms.  Are comfortable and fit you well.  Are closed at the toe. Do not wear sandals.  If you use a stepladder:  Make sure that it is fully opened. Do not climb a closed stepladder.  Make sure that both  sides of the stepladder are locked into place.  Ask someone to hold it for you, if possible.  Clearly mark and make sure that you can see:  Any grab bars or handrails.  First and last steps.  Where the edge of each step is.  Use tools that help you move around (mobility aids) if they are needed. These include:  Canes.  Walkers.  Scooters.  Crutches.  Turn on the lights when you go into a dark area. Replace any light bulbs as soon as they burn out.  Set up your furniture so you have a clear path. Avoid moving your furniture around.  If any of your floors are uneven, fix them.  If there are any pets around you, be aware of where they are.  Review your medicines with your doctor. Some medicines can make you feel dizzy. This can increase your chance of falling. Ask your doctor what other things that you can do to help prevent falls. This information is not intended to replace advice given to you by your health care provider. Make sure you discuss any questions you have with your health care provider. Document Released: 12/04/2008 Document Revised: 07/16/2015 Document Reviewed: 03/14/2014 Elsevier Interactive Patient Education  2017 [employer].

## 2019-04-22 NOTE — Progress Notes (Signed)
Subjective:   Richard Powell is a 74 y.o. male who presents for Medicare Annual/Subsequent preventive examination.  Review of Systems: N/A   This visit is being conducted through telemedicine via telephone at the nurse health advisor's home address due to the COVID-19 pandemic. This patient has given me verbal consent via doximity to conduct this visit, patient states they are participating from their home address. Patient and myself are on the telephone call. There is no referral for this visit. Some vital signs may be absent or patient reported.    Patient identification: identified by name, DOB, and current address   Cardiac Risk Factors include: advanced age (>40men, >41 women);dyslipidemia;male gender;smoking/ tobacco exposure     Objective:    Vitals: Wt 172 lb (78 kg)   BMI 24.33 kg/m   Body mass index is 24.33 kg/m.  Advanced Directives 04/22/2019 04/11/2018 04/07/2017 08/16/2016 08/02/2016 04/06/2016 04/15/2015  Does Patient Have a Medical Advance Directive? Yes Yes Yes Yes Yes Yes Yes  Type of Paramedic of [place];Living will [place];Living will [place];Living will [place];Living will [place];Living will [place];Living will Living will  Does patient want to make changes to medical advance directive? - - - No - Patient declined - - -  Copy of [place] in Chart? No - copy requested Yes - validated most recent copy scanned in chart (See row information) Yes No - copy requested No - copy requested Yes -    Tobacco Social History   Tobacco Use  Smoking Status Current Every Day Smoker  . Packs/day: 1.00  . Years: 54.00  . Pack years: 54.00  . Types: Cigarettes  Smokeless Tobacco Never Used     Ready to quit: Not Answered Counseling given: Not Answered   Clinical Intake:  Pre-visit preparation completed: Yes  Pain :  0-10 Pain Score: 1  Pain Type: Chronic pain Pain Location: Back Pain Orientation: Lower Pain Descriptors / Indicators: Aching Pain Onset: More than a month ago Pain Frequency: Intermittent     Nutritional Risks: None Diabetes: No  How often do you need to have someone help you when you read instructions, pamphlets, or other written materials from your doctor or pharmacy?: 1 - Never What is the last grade level you completed in school?: 2 Masters degrees  Interpreter Needed?: No  Information entered by :: CJohnson, LPN  Past Medical History:  Diagnosis Date  . BPH (benign prostatic hypertrophy)    finasteride ([place])  . CAD (coronary artery disease) 05/2015   3v CAD by CT scan  . Cataract   . COPD (chronic obstructive pulmonary disease) ([place])    by screening CT scan  . ED (erectile dysfunction) of organic origin   . Elevated PSA    benign prostate biopsy 02/2012 ([place])  . Hyperlipemia    mild  . Sleep apnea    has cpap; uses occasionally.  uses nose strips   Past Surgical History:  Procedure Laterality Date  . CATARACT EXTRACTION W/PHACO Right 08/02/2016   Procedure: CATARACT EXTRACTION PHACO AND INTRAOCULAR LENS PLACEMENT (IOC);  Surgeon: Birder Robson, MD;  Location: [facility] ORS;  Service: Ophthalmology;  Laterality: Right;  [place] 01:23 AP% 18.0 CDE 14.92 Fluid pack lot # [id number]  . CATARACT EXTRACTION W/PHACO Left 08/16/2016   Procedure: CATARACT EXTRACTION PHACO AND INTRAOCULAR LENS PLACEMENT (IOC);  Surgeon: Birder Robson, MD;  Location: [facility] ORS;  Service: Ophthalmology;  Laterality: Left;  [place] 01:20 AP% 24.7 CDE 19.76 Fluid pack lot # [id number]  . COLONOSCOPY  01/2010   severe diverticulosis, 1 hyperplastic polyp, rpt 5 yrs [facility])  . COLONOSCOPY  04/2015   hyperplastic polyps, severe diverticulosis, int hem, rpt 10 yrs ([place])  . HAND SURGERY Left 1960  . VASECTOMY  1973   Family History  Problem Relation Age of Onset  . Colon cancer Mother 11   . Cancer Maternal Grandmother        ? abd cancer  . CAD Neg Hx   . Stroke Neg Hx   . Diabetes Neg Hx    Social History   Socioeconomic History  . Marital status: Married    Spouse name: Not on file  . Number of children: 3  . Years of education: Not on file  . Highest education level: Not on file  Occupational History  . 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 Smoking status: Current Every Day Smoker    Packs/day: 1.00    Years: 54.00    Pack years: 54.00    Types: Cigarettes  . Smokeless tobacco: Never Used  Substance and Sexual Activity  . Alcohol use: Yes    Alcohol/week: 1.0 standard drinks    Types: 1 Cans of beer per week    Comment: beer on the weekends  . Drug use: No  . Sexual activity: Not on file  Other Topics Concern  . Not on file  Social History Narrative   Married, lives with wife   Children 3- out of home   [employer] - served in [employer]    Occupation: Engineer, civil (consulting) at [employer], retired June 2015   Activity: stays active at home, walking more, golfing   Diet: some water, fruits/vegetables daily   Social Determinants of Radio broadcast assistant Strain:   . Difficulty of Paying Living Expenses: Not on file  Food Insecurity: No Food Insecurity  . Worried About Charity fundraiser in the Last Year: Never true  . Ran Out of Food in the Last Year: Never true  Transportation Needs: No Transportation Needs  . Lack of Transportation (Medical): No  . Lack of Transportation (Non-Medical): No  Physical Activity: Sufficiently Active  . Days of Exercise per Week: 3 days  . Minutes of Exercise per Session: 60 min  Stress: No Stress Concern Present  . Feeling of Stress : Not at all  Social Connections:   . Frequency of Communication with Friends and Family: Not on file  . Frequency of Social Gatherings with Friends and Family: Not on file  . Attends Religious Services: Not on file  . Active  Member of Clubs or Organizations: Not on file  . Attends Archivist Meetings: Not on file  . Marital Status: Not on file    Outpatient Encounter Medications as of 04/22/2019  Medication Sig  . Apoaequorin (PREVAGEN PO) Take 1 tablet by mouth daily.  Marland Kitchen atorvastatin (LIPITOR) 20 MG tablet TAKE 1 TABLET DAILY  . Coenzyme Q10 (CO Q 10 PO) Take 200 mg by mouth daily.  . finasteride (PROSCAR) 5 MG tablet Take 5 mg by mouth at bedtime.   . Multiple Vitamins-Minerals (MULTIVITAMIN PO) Take 1 tablet by mouth at bedtime.   Marland Kitchen VITAMIN D, CHOLECALCIFEROL, PO Take 2,000 Units by mouth daily.  . Zinc Acetate, Oral, (ZINC ACETATE PO) Take 1 tablet by mouth  daily.  . aspirin 81 MG tablet Take 81 mg by mouth 3 (three) times a week.  . Aspirin-Caffeine (BAYER BACK & BODY) 500-32.5 MG TABS Take 1 tablet by mouth daily as needed (pain). Reported on 04/29/2015   No facility-administered encounter medications on file as of 04/22/2019.    Activities of Daily Living In your present state of health, do you have any difficulty performing the following activities: 04/22/2019  Hearing? N  Vision? N  Difficulty concentrating or making decisions? N  Walking or climbing stairs? N  Dressing or bathing? N  Doing errands, shopping? N  Preparing Food and eating ? N  Using the Toilet? N  In the past six months, have you accidently leaked urine? Y  Comment does not wears pads  Do you have problems with loss of bowel control? N  Managing your Medications? N  Managing your Finances? N  Housekeeping or managing your Housekeeping? N  Some recent data might be hidden    Patient Care Team: Ria Bush, MD as PCP - General (Family Medicine) Festus Aloe, MD as Consulting Physician (Urology)   Assessment:   This is a routine wellness examination for Richard Powell.  Exercise Activities and Dietary recommendations Current Exercise Habits: Home exercise routine, Type of exercise: Other - see comments(golf), Time  (Minutes): 60, Frequency (Times/Week): 3, Weekly Exercise (Minutes/Week): 180, Intensity: Moderate, Exercise limited by: None identified  Goals    . Increase physical activity     Starting 04/11/18, I will continue to play golf for 12 hours week and to do yard work for 4 hours per week.     . Patient Stated     04/22/2019, I will continue to play golf 3 times a week.        Fall Risk Fall Risk  04/22/2019 04/11/2018 04/07/2017 04/06/2016 04/03/2015  Falls in the past year? 0 0 No No No  Number falls in past yr: 0 - - - -  Injury with Fall? 0 - - - -  Risk for fall due to : No Fall Risks - - - -  Follow up Falls evaluation completed;Falls prevention discussed - - - -   Is the patient's home free of loose throw rugs in walkways, pet beds, electrical cords, etc?   yes      Grab bars in the bathroom? no      Handrails on the stairs?   yes      Adequate lighting?   yes  Timed Get Up and Go Performed: N/A  Depression Screen PHQ 2/9 Scores 04/22/2019 04/11/2018 04/07/2017 04/06/2016  PHQ - 2 Score 0 0 0 0  PHQ- 9 Score 0 0 0 -    Cognitive Function MMSE - Mini Mental State Exam 04/22/2019 04/11/2018 04/07/2017 04/06/2016  Orientation to time 5 5 5 5   Orientation to Place 5 5 5 5   Registration 3 3 3 3   Attention/ Calculation 5 0 0 0  Recall 3 3 3 3   Language- name 2 objects - 0 0 0  Language- repeat 1 1 1 1   Language- follow 3 step command - 3 3 3   Language- read & follow direction - 0 0 0  Write a sentence - 0 0 0  Copy design - 0 0 0  Total score - 20 20 20   Mini Cog  Mini-Cog screen was completed. Maximum score is 22. A value of 0 denotes this part of the MMSE was not completed or the patient failed this part of the  Mini-Cog screening.       Immunization History  Administered Date(s) Administered  . Fluad Quad(high Dose 65+) 11/26/2018  . Influenza, High Dose Seasonal PF 12/11/2014, 11/21/2016, 12/01/2017  . Influenza,inj,Quad PF,6+ Mos 12/15/2015  . Influenza-Unspecified  11/21/2012, 12/22/2013  . PFIZER SARS-COV-2 Vaccination 04/06/2019  . Pneumococcal Conjugate-13 03/31/2014  . Pneumococcal Polysaccharide-23 12/23/2011  . Td 06/21/2005    Qualifies for Shingles Vaccine: Yes  Screening Tests Health Maintenance  Topic Date Due  . DTAP VACCINES (1) 04/26/1945  . DTaP/Tdap/Td (2 - Tdap) 06/20/2025 (Originally 06/22/2015)  . TETANUS/TDAP  06/20/2025 (Originally 06/22/2015)  . INFLUENZA VACCINE  Completed  . Hepatitis C Screening  Completed  . PNA vac Low Risk Adult  Completed   Cancer Screenings: Lung: Low Dose CT Chest recommended if Age 13-80 years, 30 pack-year currently smoking OR have quit w/in 15 years. Patient does qualify. CT completed 12/24/2018. Colorectal: completed 04/29/2015  Additional Screenings:  Hepatitis C Screening: 04/01/2015      Plan:   Patient will continue to play golf 3 times a week.   I have personally reviewed and noted the following in the patient's chart:   . Medical and social history . Use of alcohol, tobacco or illicit drugs  . Current medications and supplements . Functional ability and status . Nutritional status . Physical activity . Advanced directives . List of other physicians . Hospitalizations, surgeries, and ER visits in previous 12 months . Vitals . Screenings to include cognitive, depression, and falls . Referrals and appointments  In addition, I have reviewed and discussed with patient certain preventive protocols, quality metrics, and best practice recommendations. A written personalized care plan for preventive services as well as general preventive health recommendations were provided to patient.     Andrez Grime, LPN  09/26/9091

## 2019-04-22 NOTE — Progress Notes (Signed)
PCP notes:  Health Maintenance: No gaps noted   Abnormal Screenings: none   Patient concerns: none   Nurse concerns: none   Next PCP appt.: 04/26/2019 @ 3 pm

## 2019-04-23 ENCOUNTER — Other Ambulatory Visit: Payer: Self-pay | Admitting: Family Medicine

## 2019-04-23 DIAGNOSIS — R972 Elevated prostate specific antigen [PSA]: Secondary | ICD-10-CM

## 2019-04-23 DIAGNOSIS — E785 Hyperlipidemia, unspecified: Secondary | ICD-10-CM

## 2019-04-24 ENCOUNTER — Other Ambulatory Visit: Payer: Medicare Other

## 2019-04-24 ENCOUNTER — Other Ambulatory Visit (INDEPENDENT_AMBULATORY_CARE_PROVIDER_SITE_OTHER): Payer: Medicare Other

## 2019-04-24 ENCOUNTER — Ambulatory Visit: Payer: Medicare Other

## 2019-04-24 DIAGNOSIS — R972 Elevated prostate specific antigen [PSA]: Secondary | ICD-10-CM

## 2019-04-24 DIAGNOSIS — E785 Hyperlipidemia, unspecified: Secondary | ICD-10-CM | POA: Diagnosis not present

## 2019-04-24 LAB — LIPID PANEL
Cholesterol: 112 mg/dL (ref 0–200)
HDL: 43.6 mg/dL (ref >39.00)
LDL Cholesterol: 58 mg/dL (ref 0–99)
NonHDL: 68.13
Total CHOL/HDL Ratio: 3
Triglycerides: 52 mg/dL (ref 0.0–149.0)
VLDL: 10.4 mg/dL (ref 0.0–40.0)

## 2019-04-24 LAB — COMPREHENSIVE METABOLIC PANEL
ALT: 19 U/L (ref 0–53)
AST: 21 U/L (ref 0–37)
Albumin: 4.1 g/dL (ref 3.5–5.2)
Alkaline Phosphatase: 89 U/L (ref 39–117)
BUN: 16 mg/dL (ref 6–23)
CO2: 28 mEq/L (ref 19–32)
Calcium: 9.5 mg/dL (ref 8.4–10.5)
Chloride: 103 mEq/L (ref 96–112)
Creatinine, Ser: 0.89 mg/dL (ref 0.40–1.50)
GFR: 83.07 mL/min (ref >60.00)
Glucose, Bld: 102 mg/dL — ABNORMAL HIGH (ref 70–99)
Potassium: 4.6 mEq/L (ref 3.5–5.1)
Sodium: 138 mEq/L (ref 135–145)
Total Bilirubin: 0.8 mg/dL (ref 0.2–1.2)
Total Protein: 6.8 g/dL (ref 6.0–8.3)

## 2019-04-24 LAB — PSA: PSA: 3.72 ng/mL (ref 0.10–4.00)

## 2019-04-26 ENCOUNTER — Ambulatory Visit (INDEPENDENT_AMBULATORY_CARE_PROVIDER_SITE_OTHER): Payer: Medicare Other | Admitting: Family Medicine

## 2019-04-26 ENCOUNTER — Other Ambulatory Visit: Payer: Self-pay

## 2019-04-26 ENCOUNTER — Encounter: Payer: Self-pay | Admitting: Family Medicine

## 2019-04-26 VITALS — BP 120/60 | HR 63 | Temp 97.7°F | Ht 70.5 in | Wt 169.2 lb

## 2019-04-26 DIAGNOSIS — L570 Actinic keratosis: Secondary | ICD-10-CM

## 2019-04-26 DIAGNOSIS — M25511 Pain in right shoulder: Secondary | ICD-10-CM

## 2019-04-26 DIAGNOSIS — G8929 Other chronic pain: Secondary | ICD-10-CM | POA: Diagnosis not present

## 2019-04-26 DIAGNOSIS — J449 Chronic obstructive pulmonary disease, unspecified: Secondary | ICD-10-CM

## 2019-04-26 DIAGNOSIS — N400 Enlarged prostate without lower urinary tract symptoms: Secondary | ICD-10-CM

## 2019-04-26 DIAGNOSIS — I700 Atherosclerosis of aorta: Secondary | ICD-10-CM

## 2019-04-26 DIAGNOSIS — R972 Elevated prostate specific antigen [PSA]: Secondary | ICD-10-CM | POA: Diagnosis not present

## 2019-04-26 DIAGNOSIS — Z7189 Other specified counseling: Secondary | ICD-10-CM

## 2019-04-26 DIAGNOSIS — F17200 Nicotine dependence, unspecified, uncomplicated: Secondary | ICD-10-CM | POA: Diagnosis not present

## 2019-04-26 DIAGNOSIS — E785 Hyperlipidemia, unspecified: Secondary | ICD-10-CM

## 2019-04-26 MED ORDER — ATORVASTATIN CALCIUM 20 MG PO TABS: 20.0000 mg | ORAL_TABLET | Freq: Every day | ORAL | 3 refills | Status: DC

## 2019-04-26 NOTE — Patient Instructions (Addendum)
Good to see you today We will refer you to dermatologist for skin evaluation.  Return as needed or in 1 year for next physical.  You likely have rotator cuff tendonitis. Treat with exercises provided today   Health Maintenance After Age 76 After age 50, you are at a higher risk for certain long-term diseases and infections as well as injuries from falls. Falls are a major cause of broken bones and head injuries in people who are older than age 55. Getting regular preventive care can help to keep you healthy and well. Preventive care includes getting regular testing and making lifestyle changes as recommended by your health care provider. Talk with your health care provider about:  Which screenings and tests you should have. A screening is a test that checks for a disease when you have no symptoms.  A diet and exercise plan that is right for you. What should I know about screenings and tests to prevent falls? Screening and testing are the best ways to find a health problem early. Early diagnosis and treatment give you the best chance of managing medical conditions that are common after age 35. Certain conditions and lifestyle choices may make you more likely to have a fall. Your health care provider may recommend:  Regular vision checks. Poor vision and conditions such as cataracts can make you more likely to have a fall. If you wear glasses, make sure to get your prescription updated if your vision changes.  Medicine review. Work with your health care provider to regularly review all of the medicines you are taking, including over-the-counter medicines. Ask your health care provider about any side effects that may make you more likely to have a fall. Tell your health care provider if any medicines that you take make you feel dizzy or sleepy.  Osteoporosis screening. Osteoporosis is a condition that causes the bones to get weaker. This can make the bones weak and cause them to break more  easily.  Blood pressure screening. Blood pressure changes and medicines to control blood pressure can make you feel dizzy.  Strength and balance checks. Your health care provider may recommend certain tests to check your strength and balance while standing, walking, or changing positions.  Foot health exam. Foot pain and numbness, as well as not wearing proper footwear, can make you more likely to have a fall.  Depression screening. You may be more likely to have a fall if you have a fear of falling, feel emotionally low, or feel unable to do activities that you used to do.  Alcohol use screening. Using too much alcohol can affect your balance and may make you more likely to have a fall. What actions can I take to lower my risk of falls? General instructions  Talk with your health care provider about your risks for falling. Tell your health care provider if: ? You fall. Be sure to tell your health care provider about all falls, even ones that seem minor. ? You feel dizzy, sleepy, or off-balance.  Take over-the-counter and prescription medicines only as told by your health care provider. These include any supplements.  Eat a healthy diet and maintain a healthy weight. A healthy diet includes low-fat dairy products, low-fat (lean) meats, and fiber from whole grains, beans, and lots of fruits and vegetables. Home safety  Remove any tripping hazards, such as rugs, cords, and clutter.  Install safety equipment such as grab bars in bathrooms and safety rails on stairs.  Keep rooms and walkways  well-lit. Activity   Follow a regular exercise program to stay fit. This will help you maintain your balance. Ask your health care provider what types of exercise are appropriate for you.  If you need a cane or walker, use it as recommended by your health care provider.  Wear supportive shoes that have nonskid soles. Lifestyle  Do not drink alcohol if your health care provider tells you not to  drink.  If you drink alcohol, limit how much you have: ? 0-1 drink a day for women. ? 0-2 drinks a day for men.  Be aware of how much alcohol is in your drink. In the [place] one drink equals one typical bottle of beer (12 oz), one-half glass of wine (5 oz), or one shot of hard liquor (1 oz).  Do not use any products that contain nicotine or tobacco, such as cigarettes and e-cigarettes. If you need help quitting, ask your health care provider. Summary  Having a healthy lifestyle and getting preventive care can help to protect your health and wellness after age 29.  Screening and testing are the best way to find a health problem early and help you avoid having a fall. Early diagnosis and treatment give you the best chance for managing medical conditions that are more common for people who are older than age 74.  Falls are a major cause of broken bones and head injuries in people who are older than age 52. Take precautions to prevent a fall at home.  Work with your health care provider to learn what changes you can make to improve your health and wellness and to prevent falls. This information is not intended to replace advice given to you by your health care provider. Make sure you discuss any questions you have with your health care provider. Document Revised: 05/31/2018 Document Reviewed: 12/21/2016 Elsevier Patient Education  2020 [employer].

## 2019-04-26 NOTE — Progress Notes (Signed)
This visit was conducted in person.  BP 120/60 (BP Location: Left Arm, Patient Position: Sitting, Cuff Size: Normal)   Pulse 63   Temp 97.7 F (36.5 C) (Temporal)   Ht 5' 10.5" (1.791 m)   Wt 169 lb 3 oz (76.7 kg)   SpO2 97%   BMI 23.93 kg/m    CC: AMW f/u visit Subjective:    Patient ID: Richard Powell, male    DOB: 1945-10-06, 74 y.o.   MRN: [id number]  HPI: Richard Powell is a 74 y.o. male presenting on 04/26/2019 for Annual Exam (Prt 2. )   Saw health advisor last week for medicare wellness visit. Note reviewed.   No exam data present    Clinical Support from 04/22/2019 in [place] at [facility] Total Score  0      Fall Risk  04/22/2019 04/11/2018 04/07/2017 04/06/2016 04/03/2015  Falls in the past year? 0 0 No No No  Number falls in past yr: 0 - - - -  Injury with Fall? 0 - - - -  Risk for fall due to : No Fall Risks - - - -  Follow up Falls evaluation completed;Falls prevention discussed - - - -    Wife has recovered well from breast cancer.   Worsening R shoulder pain - wakes him up occasionally. Still can play golf but some pain when raising arm certain ways. Declines further evaluation today.   Preventative: COLONOSCOPY 04/2015 hyperplastic polyps, severe diverticulosis, int hem, rpt 10 yrs ([place]) Prostate cancer screening - elevated PSA followed by DR Junious Silk. No fmhx prostate cancer. ++BPH sxs. Had reassuring prostate MRI then recent biopsy.  Lung cancer screening - LRCTlung scan yearly - stable. first CT was 2015 Flu shot yearly  Pneumovax - 2013. Prevnar 2016.  Td 2007  Zostavax -declines. Did not have chicken pox. Shingrix - discussed  Pfizer covid vaccine - first shot completed  Advanced directives: has living will at home. HCPOA would be wife then oldest son. Copy in chart  Seat belt use discussed  Sunscreen use discussed. No changing moles on skin.Using wide brimmed hat  Alcohol occasionally on weekends Smoking 1 ppd x 50+  yrs - currently 3/4 ppd. Wife also smokes.  Dentist q6 mo - has dentures Eye exam yearly (significant benefit from cataract surgery 07/2016) Bowel - no constipation  Bladder - some incomplete emptying leading to leaking - uses pad   Married, lives with wife  Children 3- out of home  [employer] - served in [employer] Occupation: Engineer, civil (consulting) at [employer], retired June 2015 Activity: stays active in the yard, walking more, golfing 3x/wk Diet: some water, fruits/vegetables daily     Relevant past medical, surgical, family and social history reviewed and updated as indicated. Interim medical history since our last visit reviewed. Allergies and medications reviewed and updated. Outpatient Medications Prior to Visit  Medication Sig Dispense Refill  . Apoaequorin (PREVAGEN PO) Take 1 tablet by mouth daily.    . Coenzyme Q10 (CO Q 10 PO) Take 200 mg by mouth daily.    . finasteride (PROSCAR) 5 MG tablet Take 5 mg by mouth at bedtime.     . Multiple Vitamins-Minerals (MULTIVITAMIN PO) Take 1 tablet by mouth at bedtime.     Marland Kitchen VITAMIN D, CHOLECALCIFEROL, PO Take 2,000 Units by mouth daily.    . Zinc Acetate, Oral, (ZINC ACETATE PO) Take 1 tablet by mouth daily.    Marland Kitchen atorvastatin (LIPITOR) 20 MG tablet  TAKE 1 TABLET DAILY 90 tablet 0  . aspirin 81 MG tablet Take 81 mg by mouth 3 (three) times a week.    . Aspirin-Caffeine (BAYER BACK & BODY) 500-32.5 MG TABS Take 1 tablet by mouth daily as needed (pain). Reported on 04/29/2015     No facility-administered medications prior to visit.     Per HPI unless specifically indicated in ROS section below Review of Systems Objective:    BP 120/60 (BP Location: Left Arm, Patient Position: Sitting, Cuff Size: Normal)   Pulse 63   Temp 97.7 F (36.5 C) (Temporal)   Ht 5' 10.5" (1.791 m)   Wt 169 lb 3 oz (76.7 kg)   SpO2 97%   BMI 23.93 kg/m   Wt Readings from Last 3 Encounters:  04/26/19 169 lb 3 oz (76.7 kg)  04/22/19 172 lb (78 kg)  04/13/18 175 lb  4 oz (79.5 kg)    Physical Exam Vitals and nursing note reviewed.  Constitutional:      General: He is not in acute distress.    Appearance: Normal appearance. He is well-developed. He is not ill-appearing.  HENT:     Head: Normocephalic and atraumatic.     Right Ear: Hearing, tympanic membrane, ear canal and external ear normal.     Left Ear: Hearing, tympanic membrane, ear canal and external ear normal.     Mouth/Throat:     Pharynx: Uvula midline.  Eyes:     General: No scleral icterus.    Extraocular Movements: Extraocular movements intact.     Conjunctiva/sclera: Conjunctivae normal.     Pupils: Pupils are equal, round, and reactive to light.  Cardiovascular:     Rate and Rhythm: Normal rate and regular rhythm.     Pulses: Normal pulses.          Radial pulses are 2+ on the right side and 2+ on the left side.     Heart sounds: Normal heart sounds. No murmur.  Pulmonary:     Effort: Pulmonary effort is normal. No respiratory distress.     Breath sounds: Normal breath sounds. No wheezing, rhonchi or rales.  Abdominal:     General: Abdomen is flat. Bowel sounds are normal. There is no distension.     Palpations: Abdomen is soft. There is no mass.     Tenderness: There is no abdominal tenderness. There is no guarding or rebound.     Hernia: No hernia is present.  Musculoskeletal:        General: Normal range of motion.     Cervical back: Normal range of motion and neck supple.     Right lower leg: No edema.     Left lower leg: No edema.     Comments:  L shoulder WNL R shoulder exam: No deformity of shoulders on inspection. Discomfort to palpation at R anterior shoulder FROM in abduction and forward flexion. Pain with testing SITS in ext rotation. No significant pain with empty can sign. Neg Speed test. No impingement. No pain with rotation of humeral head in [facility] joint.   Lymphadenopathy:     Cervical: No cervical adenopathy.  Skin:    General: Skin is warm and dry.       Findings: No rash.  Neurological:     General: No focal deficit present.     Mental Status: He is alert and oriented to person, place, and time.     Comments: CN grossly intact, station and gait intact  Psychiatric:  Mood and Affect: Mood normal.        Behavior: Behavior normal.        Thought Content: Thought content normal.        Judgment: Judgment normal.       Results for orders placed or performed in visit on 04/24/19  Lipid panel  Result Value Ref Range   Cholesterol 112 0 - 200 mg/dL   Triglycerides 52.0 0.0 - 149.0 mg/dL   HDL 43.60 >39.00 mg/dL   VLDL 10.4 0.0 - 40.0 mg/dL   LDL Cholesterol 58 0 - 99 mg/dL   Total CHOL/HDL Ratio 3    NonHDL 68.13   Comprehensive metabolic panel  Result Value Ref Range   Sodium 138 135 - 145 mEq/L   Potassium 4.6 3.5 - 5.1 mEq/L   Chloride 103 96 - 112 mEq/L   CO2 28 19 - 32 mEq/L   Glucose, Bld 102 (H) 70 - 99 mg/dL   BUN 16 6 - 23 mg/dL   Creatinine, Ser 0.89 0.40 - 1.50 mg/dL   Total Bilirubin 0.8 0.2 - 1.2 mg/dL   Alkaline Phosphatase 89 39 - 117 U/L   AST 21 0 - 37 U/L   ALT 19 0 - 53 U/L   Total Protein 6.8 6.0 - 8.3 g/dL   Albumin 4.1 3.5 - 5.2 g/dL   GFR 83.07 >60.00 mL/min   Calcium 9.5 8.4 - 10.5 mg/dL  PSA  Result Value Ref Range   PSA 3.72 0.10 - 4.00 ng/mL   Assessment & Plan:  This visit occurred during the SARS-CoV-2 public health emergency.  Safety protocols were in place, including screening questions prior to the visit, additional usage of staff PPE, and extensive cleaning of exam room while observing appropriate contact time as indicated for disinfecting solutions.   Problem List Items Addressed This Visit    Thoracic aorta atherosclerosis ([place])    Continue statin.      Relevant Medications   atorvastatin (LIPITOR) 20 MG tablet   Smoker    Continue to encourage cessation. Contemplative.  Continue lung cancer screening program.       Right shoulder pain    Exam consistent with R RTC  tendinopathy - provided with resistance band and stretching exercises from [facility] pt advisor.       HLD (hyperlipidemia)    Chronic, stable. Continue atorvastatin  The ASCVD Risk score Mikey Bussing DC Jr., et al., 2013) failed to calculate for the following reasons:   The valid total cholesterol range is 130 to 320 mg/dL       Relevant Medications   atorvastatin (LIPITOR) 20 MG tablet   ELEVATED PROSTATE SPECIFIC ANTIGEN    Appreciate uro care - sees [place]. PSA today stable.       COPD (chronic obstructive pulmonary disease) (HCC)    Stable off respiratory medication. CT with evidence of COPD.       Benign prostatic hyperplasia   AK (actinic keratosis) - Primary    Refer to derm.       Relevant Orders   Ambulatory referral to Dermatology   Advanced care planning/counseling discussion    Advanced directives: has living will at home. HCPOA would be wife then oldest son. Copy in chart           Meds ordered this encounter  Medications  . atorvastatin (LIPITOR) 20 MG tablet    Sig: Take 1 tablet (20 mg total) by mouth daily.    Dispense:  90 tablet  Refill:  3   Orders Placed This Encounter  Procedures  . Ambulatory referral to Dermatology    Referral Priority:   Routine    Referral Type:   Consultation    Referral Reason:   Specialty Services Required    Requested Specialty:   Dermatology    Number of Visits Requested:   1    Patient instructions: Good to see you today We will refer you to dermatologist for skin evaluation.  Return as needed or in 1 year for next physical.  You likely have rotator cuff tendonitis. Treat with exercises provided today   Follow up plan: Return in 1 year (on 04/25/2020), or if symptoms worsen or fail to improve, for medicare wellness visit.  Ria Bush, MD

## 2019-04-27 DIAGNOSIS — M25511 Pain in right shoulder: Secondary | ICD-10-CM | POA: Insufficient documentation

## 2019-04-27 NOTE — Assessment & Plan Note (Addendum)
Continue to encourage cessation. Contemplative.  Continue lung cancer screening program.

## 2019-04-27 NOTE — Assessment & Plan Note (Signed)
Appreciate uro care - sees [place]. PSA today stable.

## 2019-04-27 NOTE — Assessment & Plan Note (Signed)
Advanced directives: has living will at home. HCPOA would be wife then oldest son. Copy in chart

## 2019-04-27 NOTE — Assessment & Plan Note (Signed)
Stable off respiratory medication. CT with evidence of COPD.

## 2019-04-27 NOTE — Assessment & Plan Note (Signed)
Refer to derm  ?

## 2019-04-27 NOTE — Assessment & Plan Note (Signed)
Continue statin. 

## 2019-04-27 NOTE — Assessment & Plan Note (Signed)
Chronic, stable. Continue atorvastatin  The ASCVD Risk score Richard Bussing DC Jr., Richard al., 2013) failed to calculate for the following reasons:   The valid total cholesterol range is 130 to 320 mg/dL

## 2019-04-27 NOTE — Assessment & Plan Note (Addendum)
Exam consistent with R RTC tendinopathy - provided with resistance band and stretching exercises from [facility] pt advisor.

## 2019-04-28 ENCOUNTER — Ambulatory Visit: Payer: Medicare Other | Attending: Internal Medicine

## 2019-04-28 DIAGNOSIS — Z23 Encounter for immunization: Secondary | ICD-10-CM

## 2019-04-28 NOTE — Progress Notes (Signed)
   Covid-19 Vaccination Clinic  Name:  Richard Powell    MRN: [id number] DOB: 1945-06-17  04/28/2019  Mr. Richard Powell was observed post Covid-19 immunization for 15 minutes without incident. He was provided with Vaccine Information Sheet and instruction to access the V-Safe system.   Mr. Richard Powell was instructed to call 911 with any severe reactions post vaccine: Marland Kitchen Difficulty breathing  . Swelling of face and throat  . A fast heartbeat  . A bad rash all over body  . Dizziness and weakness   Immunizations Administered    Name Date Dose VIS Date Route   Pfizer COVID-19 Vaccine 04/28/2019 12:44 PM 0.3 mL 02/01/2019 Intramuscular   Manufacturer: [place]   Lot: [id number]   [place]: [address]-4

## 2019-05-07 DIAGNOSIS — X32XXXA Exposure to sunlight, initial encounter: Secondary | ICD-10-CM | POA: Diagnosis not present

## 2019-05-07 DIAGNOSIS — L570 Actinic keratosis: Secondary | ICD-10-CM | POA: Diagnosis not present

## 2019-06-08 ENCOUNTER — Encounter: Payer: Self-pay | Admitting: Family Medicine

## 2019-11-12 ENCOUNTER — Ambulatory Visit (INDEPENDENT_AMBULATORY_CARE_PROVIDER_SITE_OTHER): Payer: Medicare Other | Admitting: Family Medicine

## 2019-11-12 ENCOUNTER — Other Ambulatory Visit: Payer: Self-pay

## 2019-11-12 ENCOUNTER — Encounter: Payer: Self-pay | Admitting: Family Medicine

## 2019-11-12 VITALS — BP 142/62 | HR 64 | Temp 96.5°F | Ht 70.5 in | Wt 169.3 lb

## 2019-11-12 DIAGNOSIS — R300 Dysuria: Secondary | ICD-10-CM

## 2019-11-12 LAB — POC URINALSYSI DIPSTICK (AUTOMATED)
Bilirubin, UA: NEGATIVE
Blood, UA: NEGATIVE
Glucose, UA: NEGATIVE
Ketones, UA: NEGATIVE
Leukocytes, UA: NEGATIVE
Nitrite, UA: NEGATIVE
Protein, UA: NEGATIVE
Spec Grav, UA: 1.02
Urobilinogen, UA: 0.2 U/dL
pH, UA: 6.5

## 2019-11-12 MED ORDER — SULFAMETHOXAZOLE-TRIMETHOPRIM 800-160 MG PO TABS: 1.0000 | ORAL_TABLET | Freq: Two times a day (BID) | ORAL | 0 refills | Status: DC

## 2019-11-12 NOTE — Patient Instructions (Signed)
If you have symptoms, then start the antibiotics and let me know.   We'll culture your urine.  Take care.  Glad to see you.

## 2019-11-12 NOTE — Progress Notes (Signed)
This visit occurred during the SARS-CoV-2 public health emergency.  Safety protocols were in place, including screening questions prior to the visit, additional usage of staff PPE, and extensive cleaning of exam room while observing appropriate contact time as indicated for disinfecting solutions.  Lower abd discomfort/cramp last week.  Mild sx at the time.  Still with solid stools.  No blood in urine or stool.  Recurrent lower abd pain.  He did OTC urine test, positive, and started taking cranberry juice, inc PO fluids.  He has h/o BPH.  He had urinary frequency.  Still on finasteride at baseline.  abd pain is resolved now, as of this AM.  He feels well now.  No fevers.    Meds, vitals, and allergies reviewed.   ROS: Per HPI unless specifically indicated in ROS section   GEN: nad, alert and oriented HEENT: ncat NECK: supple w/o LA CV: rrr. PULM: ctab, no inc wob ABD: soft, +bs, not tender to palpation. EXT: no edema SKIN: Well-perfused No CVA pain.

## 2019-11-13 DIAGNOSIS — R300 Dysuria: Secondary | ICD-10-CM | POA: Insufficient documentation

## 2019-11-13 LAB — URINE CULTURE
MICRO NUMBER:: 10976507
Result:: NO GROWTH
SPECIMEN QUALITY:: ADEQUATE

## 2019-11-13 NOTE — Assessment & Plan Note (Signed)
Symptoms improved in the meantime with fluids and over-the-counter medication.  Benign abdominal exam.  Urinalysis discussed with patient.  Check urine culture.  Hold Septra for now.  If he has return of symptoms to start Septra.  He will update [place] as needed.  Okay for outpatient follow-up.  He agrees with plan.

## 2019-12-19 DIAGNOSIS — S96912A Strain of unspecified muscle and tendon at ankle and foot level, left foot, initial encounter: Secondary | ICD-10-CM | POA: Diagnosis not present

## 2019-12-27 DIAGNOSIS — R3915 Urgency of urination: Secondary | ICD-10-CM | POA: Diagnosis not present

## 2019-12-27 DIAGNOSIS — R972 Elevated prostate specific antigen [PSA]: Secondary | ICD-10-CM | POA: Diagnosis not present

## 2019-12-27 DIAGNOSIS — N401 Enlarged prostate with lower urinary tract symptoms: Secondary | ICD-10-CM | POA: Diagnosis not present

## 2019-12-27 DIAGNOSIS — N5201 Erectile dysfunction due to arterial insufficiency: Secondary | ICD-10-CM | POA: Diagnosis not present

## 2019-12-30 ENCOUNTER — Ambulatory Visit
Admission: RE | Admit: 2019-12-30 | Discharge: 2019-12-30 | Disposition: A | Discharge status: Home / Self Care | Payer: Medicare Other | Source: Ambulatory Visit | Attending: Acute Care | Admitting: Acute Care

## 2019-12-30 DIAGNOSIS — J439 Emphysema, unspecified: Secondary | ICD-10-CM | POA: Diagnosis not present

## 2019-12-30 DIAGNOSIS — Z87891 Personal history of nicotine dependence: Secondary | ICD-10-CM

## 2019-12-30 DIAGNOSIS — Z122 Encounter for screening for malignant neoplasm of respiratory organs: Secondary | ICD-10-CM

## 2019-12-30 DIAGNOSIS — F17210 Nicotine dependence, cigarettes, uncomplicated: Secondary | ICD-10-CM

## 2019-12-30 DIAGNOSIS — I2510 Atherosclerotic heart disease of native coronary artery without angina pectoris: Secondary | ICD-10-CM | POA: Diagnosis not present

## 2019-12-30 DIAGNOSIS — I700 Atherosclerosis of aorta: Secondary | ICD-10-CM | POA: Diagnosis not present

## 2019-12-31 NOTE — Progress Notes (Signed)
Please call patient and let them  know their  low dose Ct was read as a Lung RADS 2: nodules that are benign in appearance and behavior with a very low likelihood of becoming a clinically active cancer due to size or lack of growth. Recommendation per radiology is for a repeat LDCT in 12 months. .Please let them  know we will order and schedule their  annual screening scan for 12/2020. Please let them  know there was notation of CAD on their  scan.  Please remind the patient  that this is a non-gated exam therefore degree or severity of disease  cannot be determined. Please have them  follow up with their PCP regarding potential risk factor modification, dietary therapy or pharmacologic therapy if clinically indicated. Pt.  is  currently on statin therapy. Please place order for annual  screening scan for  12/2020 and fax results to PCP. Thanks so much. 

## 2020-01-01 ENCOUNTER — Other Ambulatory Visit: Payer: Self-pay | Admitting: *Deleted

## 2020-01-01 DIAGNOSIS — Z87891 Personal history of nicotine dependence: Secondary | ICD-10-CM

## 2020-01-01 DIAGNOSIS — F17210 Nicotine dependence, cigarettes, uncomplicated: Secondary | ICD-10-CM

## 2020-04-21 ENCOUNTER — Other Ambulatory Visit: Payer: Self-pay | Admitting: Family Medicine

## 2020-04-21 NOTE — Telephone Encounter (Signed)
Pharmacy requests refill on: Atorvastatin 20 mg   LAST REFILL: 04/26/2019 (Q-90, R-3) LAST OV: 04/26/2019 NEXT OV: Not Scheduled  PHARMACY: [facility]

## 2020-06-22 ENCOUNTER — Encounter: Payer: Medicare Other | Admitting: Family Medicine

## 2020-06-24 ENCOUNTER — Other Ambulatory Visit: Payer: Self-pay | Admitting: Family Medicine

## 2020-06-24 DIAGNOSIS — R972 Elevated prostate specific antigen [PSA]: Secondary | ICD-10-CM

## 2020-06-24 DIAGNOSIS — E785 Hyperlipidemia, unspecified: Secondary | ICD-10-CM

## 2020-06-25 ENCOUNTER — Other Ambulatory Visit (INDEPENDENT_AMBULATORY_CARE_PROVIDER_SITE_OTHER): Payer: Medicare Other

## 2020-06-25 ENCOUNTER — Other Ambulatory Visit: Payer: Self-pay

## 2020-06-25 DIAGNOSIS — E785 Hyperlipidemia, unspecified: Secondary | ICD-10-CM

## 2020-06-25 DIAGNOSIS — R972 Elevated prostate specific antigen [PSA]: Secondary | ICD-10-CM

## 2020-06-25 LAB — COMPREHENSIVE METABOLIC PANEL
ALT: 18 U/L (ref 0–53)
AST: 24 U/L (ref 0–37)
Albumin: 4.3 g/dL (ref 3.5–5.2)
Alkaline Phosphatase: 83 U/L (ref 39–117)
BUN: 19 mg/dL (ref 6–23)
CO2: 26 mEq/L (ref 19–32)
Calcium: 9.1 mg/dL (ref 8.4–10.5)
Chloride: 106 mEq/L (ref 96–112)
Creatinine, Ser: 0.97 mg/dL (ref 0.40–1.50)
GFR: 75.39 mL/min (ref >60.00)
Glucose, Bld: 89 mg/dL (ref 70–99)
Potassium: 4.2 mEq/L (ref 3.5–5.1)
Sodium: 139 mEq/L (ref 135–145)
Total Bilirubin: 0.8 mg/dL (ref 0.2–1.2)
Total Protein: 6.6 g/dL (ref 6.0–8.3)

## 2020-06-25 LAB — LIPID PANEL
Cholesterol: 96 mg/dL (ref 0–200)
HDL: 40 mg/dL (ref >39.00)
LDL Cholesterol: 42 mg/dL (ref 0–99)
NonHDL: 55.62
Total CHOL/HDL Ratio: 2
Triglycerides: 67 mg/dL (ref 0.0–149.0)
VLDL: 13.4 mg/dL (ref 0.0–40.0)

## 2020-06-25 LAB — PSA: PSA: 3.49 ng/mL (ref 0.10–4.00)

## 2020-06-26 ENCOUNTER — Ambulatory Visit: Payer: Medicare Other

## 2020-07-03 ENCOUNTER — Ambulatory Visit (INDEPENDENT_AMBULATORY_CARE_PROVIDER_SITE_OTHER): Payer: Medicare Other | Admitting: Family Medicine

## 2020-07-03 ENCOUNTER — Other Ambulatory Visit: Payer: Self-pay

## 2020-07-03 ENCOUNTER — Encounter: Payer: Self-pay | Admitting: Family Medicine

## 2020-07-03 VITALS — BP 140/82 | HR 60 | Temp 97.2°F | Ht 70.5 in | Wt 173.0 lb

## 2020-07-03 DIAGNOSIS — E785 Hyperlipidemia, unspecified: Secondary | ICD-10-CM

## 2020-07-03 DIAGNOSIS — I700 Atherosclerosis of aorta: Secondary | ICD-10-CM | POA: Diagnosis not present

## 2020-07-03 DIAGNOSIS — N400 Enlarged prostate without lower urinary tract symptoms: Secondary | ICD-10-CM

## 2020-07-03 DIAGNOSIS — F17200 Nicotine dependence, unspecified, uncomplicated: Secondary | ICD-10-CM | POA: Diagnosis not present

## 2020-07-03 DIAGNOSIS — R972 Elevated prostate specific antigen [PSA]: Secondary | ICD-10-CM | POA: Diagnosis not present

## 2020-07-03 DIAGNOSIS — G4733 Obstructive sleep apnea (adult) (pediatric): Secondary | ICD-10-CM

## 2020-07-03 DIAGNOSIS — L570 Actinic keratosis: Secondary | ICD-10-CM

## 2020-07-03 DIAGNOSIS — Z7189 Other specified counseling: Secondary | ICD-10-CM | POA: Diagnosis not present

## 2020-07-03 DIAGNOSIS — I2510 Atherosclerotic heart disease of native coronary artery without angina pectoris: Secondary | ICD-10-CM | POA: Diagnosis not present

## 2020-07-03 DIAGNOSIS — Z0000 Encounter for general adult medical examination without abnormal findings: Secondary | ICD-10-CM

## 2020-07-03 DIAGNOSIS — J449 Chronic obstructive pulmonary disease, unspecified: Secondary | ICD-10-CM | POA: Diagnosis not present

## 2020-07-03 MED ORDER — ATORVASTATIN CALCIUM 20 MG PO TABS: 1.0000 | ORAL_TABLET | Freq: Every day | ORAL | 3 refills | Status: DC

## 2020-07-03 NOTE — Patient Instructions (Addendum)
EKG today.  Blood pressure borderline today - back off salt/sodium in the diet.  Consider shingrix vaccine.  Good to see you today You are doing well  Return as needed or in 1 year for next physical.   Health Maintenance After Age 77 After age 51, you are at a higher risk for certain long-term diseases and infections as well as injuries from falls. Falls are a major cause of broken bones and head injuries in people who are older than age 44. Getting regular preventive care can help to keep you healthy and well. Preventive care includes getting regular testing and making lifestyle changes as recommended by your health care provider. Talk with your health care provider about:  Which screenings and tests you should have. A screening is a test that checks for a disease when you have no symptoms.  A diet and exercise plan that is right for you. What should I know about screenings and tests to prevent falls? Screening and testing are the best ways to find a health problem early. Early diagnosis and treatment give you the best chance of managing medical conditions that are common after age 66. Certain conditions and lifestyle choices may make you more likely to have a fall. Your health care provider may recommend:  Regular vision checks. Poor vision and conditions such as cataracts can make you more likely to have a fall. If you wear glasses, make sure to get your prescription updated if your vision changes.  Medicine review. Work with your health care provider to regularly review all of the medicines you are taking, including over-the-counter medicines. Ask your health care provider about any side effects that may make you more likely to have a fall. Tell your health care provider if any medicines that you take make you feel dizzy or sleepy.  Osteoporosis screening. Osteoporosis is a condition that causes the bones to get weaker. This can make the bones weak and cause them to break more  easily.  Blood pressure screening. Blood pressure changes and medicines to control blood pressure can make you feel dizzy.  Strength and balance checks. Your health care provider may recommend certain tests to check your strength and balance while standing, walking, or changing positions.  Foot health exam. Foot pain and numbness, as well as not wearing proper footwear, can make you more likely to have a fall.  Depression screening. You may be more likely to have a fall if you have a fear of falling, feel emotionally low, or feel unable to do activities that you used to do.  Alcohol use screening. Using too much alcohol can affect your balance and may make you more likely to have a fall. What actions can I take to lower my risk of falls? General instructions  Talk with your health care provider about your risks for falling. Tell your health care provider if: ? You fall. Be sure to tell your health care provider about all falls, even ones that seem minor. ? You feel dizzy, sleepy, or off-balance.  Take over-the-counter and prescription medicines only as told by your health care provider. These include any supplements.  Eat a healthy diet and maintain a healthy weight. A healthy diet includes low-fat dairy products, low-fat (lean) meats, and fiber from whole grains, beans, and lots of fruits and vegetables. Home safety  Remove any tripping hazards, such as rugs, cords, and clutter.  Install safety equipment such as grab bars in bathrooms and safety rails on stairs.  Keep rooms  and walkways well-lit. Activity  Follow a regular exercise program to stay fit. This will help you maintain your balance. Ask your health care provider what types of exercise are appropriate for you.  If you need a cane or walker, use it as recommended by your health care provider.  Wear supportive shoes that have nonskid soles.   Lifestyle  Do not drink alcohol if your health care provider tells you not to  drink.  If you drink alcohol, limit how much you have: ? 0-1 drink a day for women. ? 0-2 drinks a day for men.  Be aware of how much alcohol is in your drink. In the [place] one drink equals one typical bottle of beer (12 oz), one-half glass of wine (5 oz), or one shot of hard liquor (1 oz).  Do not use any products that contain nicotine or tobacco, such as cigarettes and e-cigarettes. If you need help quitting, ask your health care provider. Summary  Having a healthy lifestyle and getting preventive care can help to protect your health and wellness after age 78.  Screening and testing are the best way to find a health problem early and help you avoid having a fall. Early diagnosis and treatment give you the best chance for managing medical conditions that are more common for people who are older than age 26.  Falls are a major cause of broken bones and head injuries in people who are older than age 41. Take precautions to prevent a fall at home.  Work with your health care provider to learn what changes you can make to improve your health and wellness and to prevent falls. This information is not intended to replace advice given to you by your health care provider. Make sure you discuss any questions you have with your health care provider. Document Revised: 05/31/2018 Document Reviewed: 12/21/2016 Elsevier Patient Education  2021 [employer].

## 2020-07-03 NOTE — Assessment & Plan Note (Signed)
Continue statin. 

## 2020-07-03 NOTE — Assessment & Plan Note (Signed)
Noted on imaging. Stable period off respiratory medication.

## 2020-07-03 NOTE — Assessment & Plan Note (Addendum)
Advanced directives: has living will at home. HCPOA would be wife then oldest son. Copy in chart (2016), no HCPOA form. Form provided today

## 2020-07-03 NOTE — Assessment & Plan Note (Signed)
Continue to encourage cessation. Continue lung cancer screening program.

## 2020-07-03 NOTE — Assessment & Plan Note (Signed)
Followed by urology Junious Silk)

## 2020-07-03 NOTE — Assessment & Plan Note (Signed)
Check baseline EKG.  Continue statin.

## 2020-07-03 NOTE — Assessment & Plan Note (Signed)

## 2020-07-03 NOTE — Assessment & Plan Note (Signed)
H/o this - does not tolerate CPAP

## 2020-07-03 NOTE — Assessment & Plan Note (Addendum)
Recurrent rough spots on hands - he will call to schedule f/u with Dr Juel Burrow office.

## 2020-07-03 NOTE — Assessment & Plan Note (Signed)
Chronic, stable on atorvastatin. The ASCVD Risk score Mikey Bussing DC Jr., et al., 2013) failed to calculate for the following reasons:   The valid total cholesterol range is 130 to 320 mg/dL

## 2020-07-03 NOTE — Progress Notes (Signed)
Patient ID: Richard Powell, male    DOB: [birth year], 75 y.o.   MRN: [id number]  This visit was conducted in person.  BP 140/82   Pulse 60   Temp (!) 97.2 F (36.2 C) (Temporal)   Ht 5' 10.5" (1.791 m)   Wt 173 lb (78.5 kg)   SpO2 99%   BMI 24.47 kg/m    CC: AMW  Subjective:   HPI: Richard Powell is a 75 y.o. male presenting on 07/03/2020 for Medicare Wellness   Did not see health advisor this year.    Hearing Screening   125Hz  250Hz  500Hz  1000Hz  2000Hz  3000Hz  4000Hz  6000Hz  8000Hz   Right ear:   40 25 20  0    Left ear:   40 40 25  0      Visual Acuity Screening   Right eye Left eye Both eyes  Without correction: 20/20 20/25 20/20   With correction:       [facility] Visit from 07/03/2020 in [facility] [place]  PHQ-2 Total Score 0      Fall Risk  07/03/2020 04/22/2019 04/11/2018 04/07/2017 04/06/2016  Falls in the past year? 0 0 0 No No  Number falls in past yr: - 0 - - -  Injury with Fall? - 0 - - -  Risk for fall due to : - No Fall Risks - - -  Follow up - Falls evaluation completed;Falls prevention discussed - - -   Noting BP elevation recently - endorses high salt diet.  He's started taking magnesium and zinc daily. Notes regular solid stools since this was started.   Preventative: COLONOSCOPY 04/2015 hyperplastic polyps, severe diverticulosis, int hem, rpt 10 yrs ([facility]) Prostate cancer screening - elevated PSA followed by Dr Junious Silk ([place]). No fmhx prostate cancer. ++BPH sxs. Had reassuring prostate MRI then recent biopsy.  Lung cancer screening - LRCTlung scan yearly - stable.first CT was 2015  Flu shot yearly  [place] 03/2019, 04/2019, no booster Pneumovax - 2013. Prevnar 2016.  Td 2007  Zostavax -declines. Did not have chicken pox. Shingrix - discussed  Advanced directives: has living will at home. HCPOA would be wife then oldest son. Copy in chart (2016), no HCPOA form. Form provided today  Seat belt use discussed   Sunscreen use discussed. No changing moles on skin.Using wide brimmed hat. Saw Dr [place] Crane for AKs.  Smoking 1 ppd x 50+ yrs - currently 3/4 ppd. Wife also smokes.  Alcoholoccasionallyon weekends Dentist q6 mo - has partial dentures  Eye exam overdue (cataract surgery 07/2016) Bowel - no diarrhea/constipation  Bladder - no incontinence   Married, lives with wife  Children 3- out of home  [employer] - served in [employer] Occupation: Engineer, civil (consulting) at [employer], retired June 2015 Activity: stays activein the yard, walking more, golfing3x/wk Diet: some water, fruits/vegetables daily     Relevant past medical, surgical, family and social history reviewed and updated as indicated. Interim medical history since our last visit reviewed. Allergies and medications reviewed and updated. Outpatient Medications Prior to Visit  Medication Sig Dispense Refill  . atorvastatin (LIPITOR) 20 MG tablet TAKE 1 TABLET DAILY 90 tablet 0  . Coenzyme Q10 (CO Q 10 PO) Take 200 mg by mouth daily.    . finasteride (PROSCAR) 5 MG tablet Take 5 mg by mouth at bedtime.     . Magnesium 500 MG TABS Take 1 tablet by mouth daily.    . Multiple Vitamins-Minerals (MULTIVITAMIN PO)  Take 1 tablet by mouth at bedtime.     Marland Kitchen VITAMIN D, CHOLECALCIFEROL, PO Take 2,000 Units by mouth daily.    . Zinc Acetate, Oral, (ZINC ACETATE PO) Take 1 tablet by mouth daily.    Marland Kitchen sulfamethoxazole-trimethoprim (BACTRIM DS) 800-160 MG tablet Take 1 tablet by mouth 2 (two) times daily. 14 tablet 0   No facility-administered medications prior to visit.     Per HPI unless specifically indicated in ROS section below Review of Systems Objective:  BP 140/82   Pulse 60   Temp (!) 97.2 F (36.2 C) (Temporal)   Ht 5' 10.5" (1.791 m)   Wt 173 lb (78.5 kg)   SpO2 99%   BMI 24.47 kg/m   Wt Readings from Last 3 Encounters:  07/03/20 173 lb (78.5 kg)  11/12/19 169 lb 5 oz (76.8 kg)  04/26/19 169 lb 3 oz (76.7 kg)      Physical  Exam Vitals and nursing note reviewed.  Constitutional:      General: He is not in acute distress.    Appearance: Normal appearance. He is well-developed. He is not ill-appearing.  HENT:     Head: Normocephalic and atraumatic.     Right Ear: Hearing, tympanic membrane, ear canal and external ear normal.     Left Ear: Hearing, tympanic membrane, ear canal and external ear normal.  Eyes:     General: No scleral icterus.    Extraocular Movements: Extraocular movements intact.     Conjunctiva/sclera: Conjunctivae normal.     Pupils: Pupils are equal, round, and reactive to light.  Neck:     Thyroid: No thyroid mass or thyromegaly.  Cardiovascular:     Rate and Rhythm: Normal rate and regular rhythm.     Pulses: Normal pulses.          Radial pulses are 2+ on the right side and 2+ on the left side.     Heart sounds: Normal heart sounds. No murmur heard.   Pulmonary:     Effort: Pulmonary effort is normal. No respiratory distress.     Breath sounds: Normal breath sounds. No wheezing, rhonchi or rales.  Abdominal:     General: Bowel sounds are normal. There is no distension.     Palpations: Abdomen is soft. There is no mass.     Tenderness: There is no abdominal tenderness. There is no guarding or rebound.     Hernia: No hernia is present.  Musculoskeletal:        General: Normal range of motion.     Cervical back: Normal range of motion and neck supple.     Right lower leg: No edema.     Left lower leg: No edema.  Lymphadenopathy:     Cervical: No cervical adenopathy.  Skin:    General: Skin is warm and dry.     Findings: No rash.  Neurological:     General: No focal deficit present.     Mental Status: He is alert and oriented to person, place, and time.     Comments:  CN grossly intact, station and gait intact Recall 3/3 Calculation 5/5 DLROW  Psychiatric:        Mood and Affect: Mood normal.        Behavior: Behavior normal.        Thought Content: Thought content  normal.        Judgment: Judgment normal.       Results for orders placed or performed in visit  on 06/25/20  PSA  Result Value Ref Range   PSA 3.49 0.10 - 4.00 ng/mL  Comprehensive metabolic panel  Result Value Ref Range   Sodium 139 135 - 145 mEq/L   Potassium 4.2 3.5 - 5.1 mEq/L   Chloride 106 96 - 112 mEq/L   CO2 26 19 - 32 mEq/L   Glucose, Bld 89 70 - 99 mg/dL   BUN 19 6 - 23 mg/dL   Creatinine, Ser 0.97 0.40 - 1.50 mg/dL   Total Bilirubin 0.8 0.2 - 1.2 mg/dL   Alkaline Phosphatase 83 39 - 117 U/L   AST 24 0 - 37 U/L   ALT 18 0 - 53 U/L   Total Protein 6.6 6.0 - 8.3 g/dL   Albumin 4.3 3.5 - 5.2 g/dL   GFR 75.39 >60.00 mL/min   Calcium 9.1 8.4 - 10.5 mg/dL  Lipid panel  Result Value Ref Range   Cholesterol 96 0 - 200 mg/dL   Triglycerides 67.0 0.0 - 149.0 mg/dL   HDL 40.00 >39.00 mg/dL   VLDL 13.4 0.0 - 40.0 mg/dL   LDL Cholesterol 42 0 - 99 mg/dL   Total CHOL/HDL Ratio 2    NonHDL 55.62    EKG - sinus bradycardia rate 50s, normal axis, 1st degree AV block, p mitrale - LAA, no acute ST/T changes Assessment & Plan:  This visit occurred during the SARS-CoV-2 public health emergency.  Safety protocols were in place, including screening questions prior to the visit, additional usage of staff PPE, and extensive cleaning of exam room while observing appropriate contact time as indicated for disinfecting solutions.   Problem List Items Addressed This Visit    HLD (hyperlipidemia)    Chronic, stable on atorvastatin. The ASCVD Risk score Mikey Bussing DC Jr., et al., 2013) failed to calculate for the following reasons:   The valid total cholesterol range is 130 to 320 mg/dL       Smoker    Continue to encourage cessation. Continue lung cancer screening program.       OSA (obstructive sleep apnea)    H/o this - does not tolerate CPAP      ELEVATED PROSTATE SPECIFIC ANTIGEN    Followed by urology Junious Silk)      Medicare annual wellness visit, subsequent - Primary    I  have personally reviewed the Medicare Annual Wellness questionnaire and have noted 1. The patient's medical and social history 2. Their use of alcohol, tobacco or illicit drugs 3. Their current medications and supplements 4. The patient's functional ability including ADL's, fall risks, home safety risks and hearing or visual impairment. Cognitive function has been assessed and addressed as indicated.  5. Diet and physical activity 6. Evidence for depression or mood disorders The patients weight, height, BMI have been recorded in the chart. I have made referrals, counseling and provided education to the patient based on review of the above and I have provided the pt with a written personalized care plan for preventive services. Provider list updated.. See scanned questionairre as needed for further documentation. Reviewed preventative protocols and updated unless pt declined.       Benign prostatic hyperplasia   Advanced care planning/counseling discussion    Advanced directives: has living will at home. HCPOA would be wife then oldest son. Copy in chart (2016), no HCPOA form. Form provided today       COPD (chronic obstructive pulmonary disease) ([place])    Noted on imaging. Stable period off respiratory medication.  Coronary artery calcification seen on CAT scan    Check baseline EKG.  Continue statin.       Relevant Orders   EKG 12-Lead (Completed)   Thoracic aorta atherosclerosis ([facility])    Continue statin      AK (actinic keratosis)    Recurrent rough spots on hands - he will call to schedule f/u with Dr Juel Burrow office.           No orders of the defined types were placed in this encounter.  Orders Placed This Encounter  Procedures  . EKG 12-Lead    Patient instructions: EKG today.  Blood pressure borderline today - back off salt/sodium in the diet.  Consider shingrix vaccine.  Good to see you today You are doing well  Return as needed or in 1 year for next  physical.   Follow up plan: Return in about 1 year (around 07/03/2021) for medicare wellness visit.  Ria Bush, MD

## 2020-08-07 ENCOUNTER — Ambulatory Visit: Payer: Medicare Other

## 2020-10-04 ENCOUNTER — Encounter: Payer: Self-pay | Admitting: Internal Medicine

## 2020-12-11 ENCOUNTER — Telehealth: Payer: Self-pay

## 2020-12-11 ENCOUNTER — Ambulatory Visit (AMBULATORY_SURGERY_CENTER): Payer: Self-pay

## 2020-12-11 VITALS — Ht 71.0 in | Wt 170.0 lb

## 2020-12-11 DIAGNOSIS — Z800 Family history of malignant neoplasm of digestive organs: Secondary | ICD-10-CM

## 2020-12-11 MED ORDER — SUTAB 1479-225-188 MG PO TABS: 1.0000 | ORAL_TABLET | ORAL | 0 refills | Status: DC

## 2020-12-11 MED ORDER — PLENVU 140 G PO SOLR: 1.0000 | Freq: Once | ORAL | 0 refills | Status: DC

## 2020-12-11 NOTE — Progress Notes (Signed)

## 2020-12-11 NOTE — Telephone Encounter (Signed)
Patient states Plenvu is on back order at his pharmacy and he is concerned he will not get the prep in time. Patient states she would like to be switched to the tablet prep. Informed patient I will send Sutab to his pharmacy and put the new instructions on my chart for him to review. Also, put them in the mail. Patient verbalized understanding.

## 2020-12-22 HISTORY — PX: COLONOSCOPY: SHX174

## 2020-12-31 ENCOUNTER — Telehealth: Payer: Self-pay | Admitting: Internal Medicine
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
## 2021-01-01 ENCOUNTER — Encounter: Payer: Medicare Other | Admitting: Internal Medicine

## 2021-01-12 ENCOUNTER — Encounter: Payer: Self-pay | Admitting: Internal Medicine

## 2021-01-12 ENCOUNTER — Other Ambulatory Visit: Payer: Self-pay | Admitting: Internal Medicine

## 2021-01-12 ENCOUNTER — Ambulatory Visit (AMBULATORY_SURGERY_CENTER): Payer: Medicare Other | Admitting: Internal Medicine

## 2021-01-12 VITALS — BP 144/66 | HR 52 | Temp 97.8°F | Resp 12 | Ht 70.5 in | Wt 170.0 lb

## 2021-01-12 DIAGNOSIS — K635 Polyp of colon: Secondary | ICD-10-CM | POA: Diagnosis not present

## 2021-01-12 DIAGNOSIS — Z86010 Personal history of colonic polyps: Secondary | ICD-10-CM | POA: Diagnosis not present

## 2021-01-12 DIAGNOSIS — Z800 Family history of malignant neoplasm of digestive organs: Secondary | ICD-10-CM | POA: Diagnosis not present

## 2021-01-12 DIAGNOSIS — D124 Benign neoplasm of descending colon: Secondary | ICD-10-CM | POA: Diagnosis not present

## 2021-01-12 DIAGNOSIS — D125 Benign neoplasm of sigmoid colon: Secondary | ICD-10-CM

## 2021-01-12 DIAGNOSIS — D123 Benign neoplasm of transverse colon: Secondary | ICD-10-CM | POA: Diagnosis not present

## 2021-01-12 MED ORDER — SODIUM CHLORIDE 0.9 % IV SOLN: 500.0000 mL | Freq: Once | INTRAVENOUS | Status: DC

## 2021-01-12 NOTE — Patient Instructions (Signed)
Read all of the handouts given to you by your recovery room nurse.  YOU HAD AN ENDOSCOPIC PROCEDURE TODAY AT [facility]:   Refer to the procedure report that was given to you for any specific questions about what was found during the examination.  If the procedure report does not answer your questions, please call your gastroenterologist to clarify.  If you requested that your care partner not be given the details of your procedure findings, then the procedure report has been included in a sealed envelope for you to review at your convenience later.  YOU SHOULD EXPECT: Some feelings of bloating in the abdomen. Passage of more gas than usual.  Walking can help get rid of the air that was put into your GI tract during the procedure and reduce the bloating. If you had a lower endoscopy (such as a colonoscopy or flexible sigmoidoscopy) you may notice spotting of blood in your stool or on the toilet paper. If you underwent a bowel prep for your procedure, you may not have a normal bowel movement for a few days.  Please Note:  You might notice some irritation and congestion in your nose or some drainage.  This is from the oxygen used during your procedure.  There is no need for concern and it should clear up in a day or so.  SYMPTOMS TO REPORT IMMEDIATELY:  Following lower endoscopy (colonoscopy or flexible sigmoidoscopy):  Excessive amounts of blood in the stool  Significant tenderness or worsening of abdominal pains  Swelling of the abdomen that is new, acute  Fever of 100F or higher   For urgent or emergent issues, a gastroenterologist can be reached at any hour by calling [phone]. Do not use MyChart messaging for urgent concerns.    DIET:  We do recommend a small meal at first, but then you may proceed to your regular diet.  Drink plenty of fluids but you should avoid alcoholic beverages for 24 hours. Try to increase the fiber in your diet, and drink plenty of  water.  ACTIVITY:  You should plan to take it easy for the rest of today and you should NOT DRIVE or use heavy machinery until tomorrow (because of the sedation medicines used during the test).    FOLLOW UP: Our staff will call the number listed on your records 48-72 hours following your procedure to check on you and address any questions or concerns that you may have regarding the information given to you following your procedure. If we do not reach you, we will leave a message.  We will attempt to reach you two times.  During this call, we will ask if you have developed any symptoms of COVID 19. If you develop any symptoms (ie: fever, flu-like symptoms, shortness of breath, cough etc.) before then, please call [phone].  If you test positive for Covid 19 in the 2 weeks post procedure, please call and report this information to [place].    If any biopsies were taken you will be contacted by phone or by letter within the next 1-3 weeks.  Please call us at [phone] if you have not heard about the biopsies in 3 weeks.    SIGNATURES/CONFIDENTIALITY: You and/or your care partner have signed paperwork which will be entered into your electronic medical record.  These signatures attest to the fact that that the information above on your After Visit Summary has been reviewed and is understood.  Full responsibility of the confidentiality of this  discharge information lies with you and/or your care-partner.

## 2021-01-12 NOTE — Progress Notes (Signed)
Called to room to assist during endoscopic procedure.  Patient ID and intended procedure confirmed with present staff. Received instructions for my participation in the procedure from the performing physician.  

## 2021-01-12 NOTE — Op Note (Signed)
[facility] Patient Name: Richard Powell Procedure Date: 01/12/2021 9:39 AM MRN: [id number] Endoscopist: Jerene Bears , MD Age: 75 Referring MD:  Date of Birth: November 08, 1945 Gender: Male Account #: [id number] Procedure:                Colonoscopy Indications:              Screening patient at increased risk: Family history                            of 1st-degree relative with colorectal cancer at                            age 75 years (or older), Last colonoscopy: March                            2017 Medicines:                Monitored Anesthesia Care Procedure:                Pre-Anesthesia Assessment:                           - Prior to the procedure, a History and Physical                            was performed, and patient medications and                            allergies were reviewed. The patient's tolerance of                            previous anesthesia was also reviewed. The risks                            and benefits of the procedure and the sedation                            options and risks were discussed with the patient.                            All questions were answered, and informed consent                            was obtained. Prior Anticoagulants: The patient has                            taken no previous anticoagulant or antiplatelet                            agents. ASA Grade Assessment: II - A patient with                            mild systemic disease. After reviewing the risks  and benefits, the patient was deemed in                            satisfactory condition to undergo the procedure.                           After obtaining informed consent, the colonoscope                            was passed under direct vision. Throughout the                            procedure, the patient's blood pressure, pulse, and                            oxygen saturations were monitored continuously. The                             Olympus CF-HQ190L [id number]) Colonoscope was                            introduced through the anus and advanced to the                            cecum, identified by appendiceal orifice and                            ileocecal valve. The colonoscopy was performed                            without difficulty. The patient tolerated the                            procedure well. The quality of the bowel                            preparation was fair. The ileocecal valve,                            appendiceal orifice, and rectum were photographed. Scope In: 9:44:37 AM Scope Out: 10:09:34 AM Scope Withdrawal Time: 0 hours 19 minutes 18 seconds  Total Procedure Duration: 0 hours 24 minutes 57 seconds  Findings:                 The digital rectal exam was normal.                           Three sessile polyps were found in the transverse                            colon. The polyps were 4 to 5 mm in size. These                            polyps were removed with a cold snare. Resection  was complete, but the polyp tissue was only                            partially retrieved.                           A 4 mm polyp was found in the descending colon. The                            polyp was sessile. The polyp was removed with a                            cold snare. Resection and retrieval were complete.                           A 5 mm polyp was found in the sigmoid colon. The                            polyp was sessile. The polyp was removed with a                            cold snare. Resection and retrieval were complete.                           Multiple small and large-mouthed diverticula were                            found in the sigmoid colon. There was narrowing of                            the colon in association with the diverticular                            opening.                           The retroflexed view of the distal  rectum and anal                            verge was normal and showed no anal or rectal                            abnormalities. Complications:            No immediate complications. Estimated Blood Loss:     Estimated blood loss was minimal. Impression:               - Preparation of the colon was fair. Preparation                            limits the sensitivity of this exam in this case.                           - Three 4 to 5 mm polyps  in the transverse colon,                            removed with a cold snare. Complete resection.                            Partial retrieval.                           - One 4 mm polyp in the descending colon, removed                            with a cold snare. Resected and retrieved.                           - One 5 mm polyp in the sigmoid colon, removed with                            a cold snare. Resected and retrieved.                           - Severe diverticulosis in the sigmoid colon. There                            was narrowing of the colon in association with the                            diverticular opening.                           - The distal rectum and anal verge are normal on                            retroflexion view. Recommendation:           - Patient has a contact number available for                            emergencies. The signs and symptoms of potential                            delayed complications were discussed with the                            patient. Return to normal activities tomorrow.                            Written discharge instructions were provided to the                            patient.                           - Resume previous diet.                           -  Continue present medications.                           - Await pathology results.                           - Repeat colonoscopy may be recommended given                            preparation quality today in setting of  polyps                            discovered today and family history of colon                            cancer.. The colonoscopy date will be determined                            after pathology results from today's exam become                            available for review and further discussion with                            the patient. Jerene Bears, MD 01/12/2021 10:14:11 AM This report has been signed electronically.

## 2021-01-12 NOTE — Progress Notes (Signed)
To Pacu, VSS. Report to [email or web address] 

## 2021-01-12 NOTE — Progress Notes (Signed)
CHECK-IN-AM  V/S-DT

## 2021-01-12 NOTE — Progress Notes (Signed)
GASTROENTEROLOGY PROCEDURE H&P NOTE   Primary Care Physician: Ria Bush, MD    Reason for Procedure:  Family history of colon cancer in first-degree relative  Plan:    Colonoscopy  Patient is appropriate for endoscopic procedure(s) in the ambulatory ([place]) setting.  The nature of the procedure, as well as the risks, benefits, and alternatives were carefully and thoroughly reviewed with the patient. Ample time for discussion and questions allowed. The patient understood, was satisfied, and agreed to proceed.     HPI: Richard Powell is a 75 y.o. male who presents for elevated rescreening colonoscopy.  Medical history as below.  Last exam March 2017.  Hyperplastic polyps only.  Tolerated the prep.  No recent chest pain or shortness of breath.  Past Medical History:  Diagnosis Date   BPH (benign prostatic hypertrophy)    finasteride ([place])   CAD (coronary artery disease) 05/2015   3v CAD by CT scan   Cataract    COPD (chronic obstructive pulmonary disease) ([place])    by screening CT scan   ED (erectile dysfunction) of organic origin    Elevated PSA    benign prostate biopsy 02/2012 ([place])   Hyperlipemia    mild   Sleep apnea    has cpap; uses occasionally.  uses nose strips    Past Surgical History:  Procedure Laterality Date   CATARACT EXTRACTION W/PHACO Right 08/02/2016   Procedure: CATARACT EXTRACTION PHACO AND INTRAOCULAR LENS PLACEMENT (IOC);  Surgeon: Birder Robson, MD;  Location: [facility] ORS;  Service: Ophthalmology;  Laterality: Right;  [place] 01:23 AP% 18.0 CDE 14.92 Fluid pack lot # [id number]   CATARACT EXTRACTION W/PHACO Left 08/16/2016   Procedure: CATARACT EXTRACTION PHACO AND INTRAOCULAR LENS PLACEMENT (IOC);  Surgeon: Birder Robson, MD;  Location: [facility] ORS;  Service: Ophthalmology;  Laterality: Left;  [place] 01:20 AP% 24.7 CDE 19.76 Fluid pack lot # [id number]   COLONOSCOPY  01/2010   severe diverticulosis, 1 hyperplastic polyp, rpt 5 yrs  Sharlett Iles)   COLONOSCOPY  04/2015   hyperplastic polyps, severe diverticulosis, int hem, rpt 10 yrs (Khris Jansson)   HAND SURGERY Left [place]    Prior to Admission medications   Medication Sig Start Date End Date Taking? Authorizing Provider  atorvastatin (LIPITOR) 20 MG tablet Take 1 tablet (20 mg total) by mouth daily. 07/03/20   Ria Bush, MD  Coenzyme Q10 (CO Q 10 PO) Take 200 mg by mouth daily.    [provider]  finasteride (PROSCAR) 5 MG tablet Take 5 mg by mouth at bedtime.     [provider]  Multiple Vitamins-Minerals (MULTIVITAMIN PO) Take 1 tablet by mouth at bedtime.     [provider]  VITAMIN D, CHOLECALCIFEROL, PO Take 2,000 Units by mouth daily.    [provider]  Zinc Acetate, Oral, (ZINC ACETATE PO) Take 1 tablet by mouth daily.    [provider]    Current Outpatient Medications  Medication Sig Dispense Refill   atorvastatin (LIPITOR) 20 MG tablet Take 1 tablet (20 mg total) by mouth daily. 90 tablet 3   Coenzyme Q10 (CO Q 10 PO) Take 200 mg by mouth daily.     finasteride (PROSCAR) 5 MG tablet Take 5 mg by mouth at bedtime.      Multiple Vitamins-Minerals (MULTIVITAMIN PO) Take 1 tablet by mouth at bedtime.      VITAMIN D, CHOLECALCIFEROL, PO Take 2,000 Units by mouth daily.     Zinc Acetate, Oral, (  ZINC ACETATE PO) Take 1 tablet by mouth daily.     Current Facility-Administered Medications  Medication Dose Route Frequency Provider Last Rate Last Admin   0.9 %  sodium chloride infusion  500 mL Intravenous Once Richard Powell, [name], MD        Allergies as of 01/12/2021   (No Known Allergies)    Family History  Problem Relation Age of Onset   Colon cancer Mother 22   Cancer Maternal Grandmother        ? 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  Tobacco Use   Smoking status: Every Day    Packs/day: 1.00    Years: 54.00    Pack years: 54.00    Types: Cigarettes   Smokeless tobacco: Never  Vaping Use   Vaping Use: Never used  Substance and Sexual Activity   Alcohol use: Yes    Alcohol/week: 1.0 standard drink    Types: 1 Cans of beer per week    Comment: beer on the weekends   Drug use: No   Sexual activity: Not on file  Other Topics Concern   Not on file  Social History Narrative   Married, lives with wife   Children 3- out of home   [employer] - served in [employer]    Occupation: Engineer, civil (consulting) at [employer], retired June 2015   Activity: stays active at home, walking more, golfing   Diet: some water, fruits/vegetables daily   Social Determinants of Radio broadcast assistant Strain: Not on file  Food Insecurity: Not on file  Transportation Needs: Not on file  Physical Activity: Not on file  Stress: Not on file  Social Connections: Not on file  Intimate Partner Violence: Not on file    Physical Exam: Vital signs in last 24 hours: @BP  130/66 (Patient Position: Sitting)   Pulse (!) 59   Temp 97.8 F (36.6 C)   Ht 5' 10.5" (1.791 m)   Wt 170 lb (77.1 kg)   SpO2 100%   BMI 24.05 kg/m  GEN: NAD EYE: Sclerae anicteric ENT: MMM CV: Non-tachycardic Pulm: CTA b/l GI: Soft, NT/ND NEURO:  Alert & Oriented x 3   Zenovia Jarred, MD [facility]  01/12/2021 9:34 AM

## 2021-01-18 ENCOUNTER — Telehealth: Payer: Self-pay

## 2021-01-18 ENCOUNTER — Telehealth: Payer: Self-pay | Admitting: *Deleted

## 2021-01-18 NOTE — Telephone Encounter (Signed)
  Follow up Call-  Call back number 01/12/2021  Post procedure Call Back phone  # [phone]  Permission to leave phone message Yes  Some recent data might be hidden     Patient questions:  Do you have a fever, pain , or abdominal swelling? No. Pain Score  0 *  Have you tolerated food without any problems? Yes.    Have you been able to return to your normal activities? Yes.    Do you have any questions about your discharge instructions: Diet   No. Medications  No. Follow up visit  No.  Do you have questions or concerns about your Care? No.  Actions: * If pain score is 4 or above: No action needed, pain <4.

## 2021-01-18 NOTE — Telephone Encounter (Signed)
  Follow up Call-  Call back number 01/12/2021  Post procedure Call Back phone  # [phone]  Permission to leave phone message Yes  Some recent data might be hidden     Patient questions: Message left to call us if necessary.

## 2021-01-20 ENCOUNTER — Encounter: Payer: Self-pay | Admitting: Internal Medicine

## 2021-02-15 ENCOUNTER — Encounter: Payer: Self-pay | Admitting: Family Medicine

## 2021-03-01 DIAGNOSIS — N401 Enlarged prostate with lower urinary tract symptoms: Secondary | ICD-10-CM | POA: Diagnosis not present

## 2021-03-01 DIAGNOSIS — N5201 Erectile dysfunction due to arterial insufficiency: Secondary | ICD-10-CM | POA: Diagnosis not present

## 2021-03-01 DIAGNOSIS — R3915 Urgency of urination: Secondary | ICD-10-CM | POA: Diagnosis not present

## 2021-03-19 ENCOUNTER — Other Ambulatory Visit: Payer: Self-pay | Admitting: *Deleted

## 2021-03-19 DIAGNOSIS — Z87891 Personal history of nicotine dependence: Secondary | ICD-10-CM

## 2021-03-19 DIAGNOSIS — F17210 Nicotine dependence, cigarettes, uncomplicated: Secondary | ICD-10-CM

## 2021-06-24 DIAGNOSIS — H40003 Preglaucoma, unspecified, bilateral: Secondary | ICD-10-CM | POA: Diagnosis not present

## 2021-06-28 ENCOUNTER — Other Ambulatory Visit: Payer: Self-pay | Admitting: Family Medicine

## 2021-07-04 ENCOUNTER — Other Ambulatory Visit: Payer: Self-pay | Admitting: Family Medicine

## 2021-07-04 DIAGNOSIS — E785 Hyperlipidemia, unspecified: Secondary | ICD-10-CM

## 2021-07-04 DIAGNOSIS — N400 Enlarged prostate without lower urinary tract symptoms: Secondary | ICD-10-CM

## 2021-07-05 ENCOUNTER — Other Ambulatory Visit (INDEPENDENT_AMBULATORY_CARE_PROVIDER_SITE_OTHER): Payer: Medicare Other

## 2021-07-05 DIAGNOSIS — E785 Hyperlipidemia, unspecified: Secondary | ICD-10-CM

## 2021-07-05 LAB — COMPREHENSIVE METABOLIC PANEL
ALT: 20 U/L (ref 0–53)
AST: 21 U/L (ref 0–37)
Albumin: 4.3 g/dL (ref 3.5–5.2)
Alkaline Phosphatase: 86 U/L (ref 39–117)
BUN: 21 mg/dL (ref 6–23)
CO2: 24 mEq/L (ref 19–32)
Calcium: 9.3 mg/dL (ref 8.4–10.5)
Chloride: 106 mEq/L (ref 96–112)
Creatinine, Ser: 0.95 mg/dL (ref 0.40–1.50)
GFR: 76.75 mL/min (ref >60.00)
Glucose, Bld: 100 mg/dL — ABNORMAL HIGH (ref 70–99)
Potassium: 4.2 mEq/L (ref 3.5–5.1)
Sodium: 139 mEq/L (ref 135–145)
Total Bilirubin: 0.8 mg/dL (ref 0.2–1.2)
Total Protein: 7.1 g/dL (ref 6.0–8.3)

## 2021-07-05 LAB — LIPID PANEL
Cholesterol: 111 mg/dL (ref 0–200)
HDL: 48 mg/dL (ref >39.00)
LDL Cholesterol: 48 mg/dL (ref 0–99)
NonHDL: 62.61
Total CHOL/HDL Ratio: 2
Triglycerides: 74 mg/dL (ref 0.0–149.0)
VLDL: 14.8 mg/dL (ref 0.0–40.0)

## 2021-07-07 ENCOUNTER — Ambulatory Visit (INDEPENDENT_AMBULATORY_CARE_PROVIDER_SITE_OTHER): Payer: Medicare Other | Admitting: *Deleted

## 2021-07-07 DIAGNOSIS — Z0000 Encounter for general adult medical examination without abnormal findings: Secondary | ICD-10-CM | POA: Diagnosis not present

## 2021-07-07 NOTE — Patient Instructions (Signed)
Richard Powell , ?Thank you for taking time to come for your Medicare Wellness Visit. I appreciate your ongoing commitment to your health goals. Please review the following plan we discussed and let me know if I can assist you in the future.  ? ?Screening recommendations/referrals: ?Colonoscopy: no longer required ?Recommended yearly ophthalmology/optometry visit for glaucoma screening and checkup ?Recommended yearly dental visit for hygiene and checkup ? ?Vaccinations: ?Influenza vaccine: up to date ?Pneumococcal vaccine: up to date ?Tdap vaccine: Education provided ?Shingles vaccine: not required   ? ?Advanced directives: on file ? ?Conditions/risks identified:  ? ?Next appointment: 07-12-2021  @ 8:00 Richard Powell ? ?Preventive Care 78 Years and Older, Male ?Preventive care refers to lifestyle choices and visits with your health care provider that can promote health and wellness. ?What does preventive care include? ?A yearly physical exam. This is also called an annual well check. ?Dental exams once or twice a year. ?Routine eye exams. Ask your health care provider how often you should have your eyes checked. ?Personal lifestyle choices, including: ?Daily care of your teeth and gums. ?Regular physical activity. ?Eating a healthy diet. ?Avoiding tobacco and drug use. ?Limiting alcohol use. ?Practicing safe sex. ?Taking low doses of aspirin every day. ?Taking vitamin and mineral supplements as recommended by your health care provider. ?What happens during an annual well check? ?The services and screenings done by your health care provider during your annual well check will depend on your age, overall health, lifestyle risk factors, and family history of disease. ?Counseling  ?Your health care provider may ask you questions about your: ?Alcohol use. ?Tobacco use. ?Drug use. ?Emotional well-being. ?Home and relationship well-being. ?Sexual activity. ?Eating habits. ?History of falls. ?Memory and ability to understand  (cognition). ?Work and work Statistician. ?Screening  ?You may have the following tests or measurements: ?Height, weight, and BMI. ?Blood pressure. ?Lipid and cholesterol levels. These may be checked every 5 years, or more frequently if you are over 49 years old. ?Skin check. ?Lung cancer screening. You may have this screening every year starting at age 63 if you have a 30-pack-year history of smoking and currently smoke or have quit within the past 15 years. ?Fecal occult blood test (FOBT) of the stool. You may have this test every year starting at age 54. ?Flexible sigmoidoscopy or colonoscopy. You may have a sigmoidoscopy every 5 years or a colonoscopy every 10 years starting at age 38. ?Prostate cancer screening. Recommendations will vary depending on your family history and other risks. ?Hepatitis C blood test. ?Hepatitis B blood test. ?Sexually transmitted disease (STD) testing. ?Diabetes screening. This is done by checking your blood sugar (glucose) after you have not eaten for a while (fasting). You may have this done every 1-3 years. ?Abdominal aortic aneurysm (AAA) screening. You may need this if you are a current or former smoker. ?Osteoporosis. You may be screened starting at age 28 if you are at high risk. ?Talk with your health care provider about your test results, treatment options, and if necessary, the need for more tests. ?Vaccines  ?Your health care provider may recommend certain vaccines, such as: ?Influenza vaccine. This is recommended every year. ?Tetanus, diphtheria, and acellular pertussis (Tdap, Td) vaccine. You may need a Td booster every 10 years. ?Zoster vaccine. You may need this after age 68. ?Pneumococcal 13-valent conjugate (PCV13) vaccine. One dose is recommended after age 69. ?Pneumococcal polysaccharide (PPSV23) vaccine. One dose is recommended after age 9. ?Talk to your health care provider about which screenings and vaccines  you need and how often you need them. ?This  information is not intended to replace advice given to you by your health care provider. Make sure you discuss any questions you have with your health care provider. ?Document Released: 03/06/2015 Document Revised: 10/28/2015 Document Reviewed: 12/09/2014 ?Elsevier Interactive Patient Education ? 2017 [place]. ? ?Fall Prevention in the Home ?Falls can cause injuries. They can happen to people of all ages. There are many things you can do to make your home safe and to help prevent falls. ?What can I do on the outside of my home? ?Regularly fix the edges of walkways and driveways and fix any cracks. ?Remove anything that might make you trip as you walk through a door, such as a raised step or threshold. ?Trim any bushes or trees on the path to your home. ?Use bright outdoor lighting. ?Clear any walking paths of anything that might make someone trip, such as rocks or tools. ?Regularly check to see if handrails are loose or broken. Make sure that both sides of any steps have handrails. ?Any raised decks and porches should have guardrails on the edges. ?Have any leaves, snow, or ice cleared regularly. ?Use sand or salt on walking paths during winter. ?Clean up any spills in your garage right away. This includes oil or grease spills. ?What can I do in the bathroom? ?Use night lights. ?Install grab bars by the toilet and in the tub and shower. Do not use towel bars as grab bars. ?Use non-skid mats or decals in the tub or shower. ?If you need to sit down in the shower, use a plastic, non-slip stool. ?Keep the floor dry. Clean up any water that spills on the floor as soon as it happens. ?Remove soap buildup in the tub or shower regularly. ?Attach bath mats securely with double-sided non-slip rug tape. ?Do not have throw rugs and other things on the floor that can make you trip. ?What can I do in the bedroom? ?Use night lights. ?Make sure that you have a light by your bed that is easy to reach. ?Do not use any sheets or  blankets that are too big for your bed. They should not hang down onto the floor. ?Have a firm chair that has side arms. You can use this for support while you get dressed. ?Do not have throw rugs and other things on the floor that can make you trip. ?What can I do in the kitchen? ?Clean up any spills right away. ?Avoid walking on wet floors. ?Keep items that you use a lot in easy-to-reach places. ?If you need to reach something above you, use a strong step stool that has a grab bar. ?Keep electrical cords out of the way. ?Do not use floor polish or wax that makes floors slippery. If you must use wax, use non-skid floor wax. ?Do not have throw rugs and other things on the floor that can make you trip. ?What can I do with my stairs? ?Do not leave any items on the stairs. ?Make sure that there are handrails on both sides of the stairs and use them. Fix handrails that are broken or loose. Make sure that handrails are as long as the stairways. ?Check any carpeting to make sure that it is firmly attached to the stairs. Fix any carpet that is loose or worn. ?Avoid having throw rugs at the top or bottom of the stairs. If you do have throw rugs, attach them to the floor with carpet tape. ?Make sure  that you have a light switch at the top of the stairs and the bottom of the stairs. If you do not have them, ask someone to add them for you. ?What else can I do to help prevent falls? ?Wear shoes that: ?Do not have high heels. ?Have rubber bottoms. ?Are comfortable and fit you well. ?Are closed at the toe. Do not wear sandals. ?If you use a stepladder: ?Make sure that it is fully opened. Do not climb a closed stepladder. ?Make sure that both sides of the stepladder are locked into place. ?Ask someone to hold it for you, if possible. ?Clearly mark and make sure that you can see: ?Any grab bars or handrails. ?First and last steps. ?Where the edge of each step is. ?Use tools that help you move around (mobility aids) if they are  needed. These include: ?Canes. ?Walkers. ?Scooters. ?Crutches. ?Turn on the lights when you go into a dark area. Replace any light bulbs as soon as they burn out. ?Set up your furniture so you have a clear path. Avoid m

## 2021-07-07 NOTE — Progress Notes (Signed)
Subjective:   Richard Powell is a 76 y.o. male who presents for Medicare Annual/Subsequent preventive examination.  I connected with  Graylon Gunning on 07/07/21 by a telephone enabled telemedicine application and verified that I am speaking with the correct person using two identifiers.   I discussed the limitations of evaluation and management by telemedicine. The patient expressed understanding and agreed to proceed.  Patient location: home  Provider location: Tele-health-home    Review of Systems     Cardiac Risk Factors include: advanced age (>22men, >34 women);male gender;smoking/ tobacco exposure     Objective:    Today's Vitals   There is no height or weight on file to calculate BMI.     07/07/2021    9:32 AM 04/22/2019    3:14 PM 04/11/2018   10:54 AM 04/07/2017   11:20 AM 08/16/2016    7:36 AM 08/02/2016    7:22 AM 04/06/2016    9:18 AM  Advanced Directives  Does Patient Have a Medical Advance Directive? Yes Yes Yes Yes Yes Yes Yes  Type of Paramedic of [place];Living will [place];Living will [place];Living will [place];Living will [place];Living will [place];Living will  Does patient want to make changes to medical advance directive?     No - Patient declined    Copy of [place] in Chart? Yes - validated most recent copy scanned in chart (See row information) No - copy requested Yes - validated most recent copy scanned in chart (See row information) Yes No - copy requested No - copy requested Yes    Current Medications (verified) Outpatient Encounter Medications as of 07/07/2021  Medication Sig   atorvastatin (LIPITOR) 20 MG tablet TAKE 1 TABLET DAILY   Coenzyme Q10 (CO Q 10 PO) Take 200 mg by mouth daily.   finasteride (PROSCAR) 5 MG tablet Take 5 mg by mouth at bedtime.    Multiple  Vitamins-Minerals (MULTIVITAMIN PO) Take 1 tablet by mouth at bedtime.    VITAMIN D, CHOLECALCIFEROL, PO Take 2,000 Units by mouth daily.   Zinc Acetate, Oral, (ZINC ACETATE PO) Take 1 tablet by mouth daily.   No facility-administered encounter medications on file as of 07/07/2021.    Allergies (verified) Patient has no known allergies.   History: Past Medical History:  Diagnosis Date   BPH (benign prostatic hypertrophy)    finasteride ([place])   CAD (coronary artery disease) 05/2015   3v CAD by CT scan   Cataract    COPD (chronic obstructive pulmonary disease) ([place])    by screening CT scan   ED (erectile dysfunction) of organic origin    Elevated PSA    benign prostate biopsy 02/2012 ([place])   Hyperlipemia    mild   Sleep apnea    has cpap; uses occasionally.  uses nose strips   Past Surgical History:  Procedure Laterality Date   CATARACT EXTRACTION W/PHACO Right 08/02/2016   Procedure: CATARACT EXTRACTION PHACO AND INTRAOCULAR LENS PLACEMENT (IOC);  Surgeon: Birder Robson, MD;  Location: [facility] ORS;  Service: Ophthalmology;  Laterality: Right;  [place] 01:23 AP% 18.0 CDE 14.92 Fluid pack lot # [id number]   CATARACT EXTRACTION W/PHACO Left 08/16/2016   Procedure: CATARACT EXTRACTION PHACO AND INTRAOCULAR LENS PLACEMENT (IOC);  Surgeon: Birder Robson, MD;  Location: [facility] ORS;  Service: Ophthalmology;  Laterality: Left;  [place] 01:20 AP% 24.7 CDE 19.76 Fluid pack lot #  [id number]   COLONOSCOPY  01/21/2010   severe diverticulosis, 1 hyperplastic polyp, rpt 5 yrs Sharlett Iles)   COLONOSCOPY  04/22/2015   hyperplastic polyps, severe diverticulosis, int hem, rpt 10 yrs ([place])   COLONOSCOPY  12/2020   TAx3, severe diverticulosis, fair colon prep - discuss 1 yr regarding rpt ([place])   HAND SURGERY Left 02/21/1958   VASECTOMY  02/22/1971   Family History  Problem Relation Age of Onset   Colon cancer Mother 55   Cancer Maternal Grandmother        ? 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  Tobacco Use   Smoking status: Every Day    Packs/day: 1.00    Years: 54.00    Pack years: 54.00    Types: Cigarettes   Smokeless tobacco: Never  Vaping Use   Vaping Use: Never used  Substance and Sexual Activity   Alcohol use: Yes    Alcohol/week: 1.0 standard drink    Types: 1 Cans of beer per week    Comment: beer on the weekends   Drug use: No   Sexual activity: Not on file  Other Topics Concern   Not on file  Social History Narrative   Married, lives with wife   Children 3- out of home   [employer] - served in [employer]    Occupation: Engineer, civil (consulting) at [employer], retired June 2015   Activity: stays active at home, walking more, golfing   Diet: some water, fruits/vegetables daily   Social Determinants of Radio broadcast assistant Strain: Low Risk    Difficulty of Paying Living Expenses: Not hard at all  Food Insecurity: No Food Insecurity   Worried About Charity fundraiser in the Last Year: Never true   Arboriculturist in the Last Year: Never true  Transportation Needs: No Transportation Needs   Lack of Transportation (Medical): No   Lack of Transportation (Non-Medical): No  Physical Activity: Not on file  Stress: No Stress Concern Present   Feeling of Stress : Not at all  Social Connections: Moderately Integrated   Frequency of Communication with Friends and Family: Three times a week   Frequency of Social Gatherings with Friends and Family: Three times a week   Attends Religious Services: Never   Active Member of Clubs or Organizations: No    Attends Music therapist: More than 4 times per year   Marital Status: Married    Tobacco Counseling Ready to quit: Not Answered Counseling given: Not Answered   Clinical Intake:  Pre-visit preparation completed: Yes  Pain : No/denies pain     Nutritional Risks: None Diabetes: No  How often do you need to have someone help you when you read instructions, pamphlets, or other written materials from your doctor or pharmacy?: 1 - Never  Diabetic?  no  Interpreter Needed?: No  Information entered by :: Leroy Kennedy LPN  Activities of Daily Living    07/07/2021    9:54 AM  In your present state of health, do you have any difficulty performing the following activities:  Hearing? 0  Vision? 0  Difficulty concentrating or making decisions? 0  Walking or climbing stairs? 0  Dressing or bathing? 0  Doing errands, shopping? 0  Preparing Food and eating ? N  Using the Toilet? N  In the past six months, have you accidently leaked urine? N  Do you have problems with loss of bowel control? N  Managing your Medications? N  Managing your Finances? N  Housekeeping or managing your Housekeeping? N    Patient Care Team: Ria Bush, MD as PCP - General (Family Medicine) Festus Aloe, MD as Consulting Physician (Urology)  Indicate any recent Medical Services you may have received from other than [facility] providers in the past year (date may be approximate).     Assessment:   This is a routine wellness examination for Jock.  Hearing/Vision screen Hearing Screening - Comments:: No trouble hearing Vision Screening - Comments:: Up to date [facility]  Dietary issues and exercise activities discussed: Current Exercise Habits: Home exercise routine, Time (Minutes): 60, Frequency (Times/Week): 2, Weekly Exercise (Minutes/Week): 120, Intensity: Mild   Goals Addressed             This Visit's Progress    Patient Stated       Better diet        Depression Screen    07/07/2021    9:39 AM 07/03/2020    8:29 AM 04/22/2019    3:17 PM 04/11/2018   10:55 AM 04/07/2017   10:33 AM 04/06/2016    9:18 AM 04/03/2015   10:36 AM  PHQ 2/9 Scores  PHQ - 2 Score 0 0 0 0 0 0 0  PHQ- 9 Score   0 0 0      Fall Risk    07/07/2021    9:33 AM 07/03/2020    8:29 AM 04/22/2019    3:15 PM 04/11/2018   10:55 AM 04/07/2017   10:33 AM  [place] in the past year? 0 0 0 0 No  Number falls in past yr: 0  0    Injury with Fall? 0  0    Risk for fall due to :   No Fall Risks    Follow up Falls evaluation completed;Education provided;Falls prevention discussed  Falls evaluation completed;Falls prevention discussed      FALL RISK PREVENTION PERTAINING TO THE HOME:  Any stairs in or around the home? Yes  If so, are there any without handrails? No  Home free of loose throw rugs in walkways, pet beds, electrical cords, etc? Yes  Adequate lighting in your home to reduce risk of falls? Yes   ASSISTIVE DEVICES UTILIZED TO PREVENT FALLS:  Life alert? No  Use of a cane, walker or w/c? No  Grab bars in the bathroom? Yes  Shower chair or bench in shower? No  Elevated toilet seat or a handicapped toilet? Yes   TIMED UP AND GO:  Was the test performed? No .    Cognitive Function:    04/22/2019    3:20 PM 04/11/2018   10:55 AM 04/07/2017   10:33 AM 04/06/2016    9:38 AM  MMSE - Mini Mental State Exam  Orientation to time 5 5 5 5   Orientation to Place 5 5 5 5   Registration 3 3 3 3   Attention/  Calculation 5 0 0 0  Recall 3 3 3 3   Language- name 2 objects  0 0 0  Language- repeat 1 1 1 1   Language- follow 3 step command  3 3 3   Language- read & follow direction  0 0 0  Write a sentence  0 0 0  Copy design  0 0 0  Total score  20 20 20         07/07/2021    9:33 AM  6CIT Screen  What Year? 0 points  What month? 0 points  What time? 0 points  Count back from 20 0 points  Months in reverse 0 points  Repeat phrase 0 points  Total Score 0  points    Immunizations Immunization History  Administered Date(s) Administered   Fluad Quad(high Dose 65+) 11/26/2018   Influenza, High Dose Seasonal PF 12/11/2014, 11/21/2016, 12/01/2017   Influenza,inj,Quad PF,6+ Mos 12/15/2015   Influenza-Unspecified 11/21/2012, 12/22/2013   PFIZER(Purple Top)SARS-COV-2 Vaccination 04/06/2019, 04/28/2019   Pneumococcal Conjugate-13 03/31/2014   Pneumococcal Polysaccharide-23 12/23/2011   Td 06/21/2005    TDAP status: Due, Education has been provided regarding the importance of this vaccine. Advised may receive this vaccine at local pharmacy or Health Dept. Aware to provide a copy of the vaccination record if obtained from local pharmacy or Health Dept. Verbalized acceptance and understanding.  Flu Vaccine status: Up to date  Pneumococcal vaccine status: Up to date  Covid-19 vaccine status: Information provided on how to obtain vaccines.   Qualifies for Shingles Vaccine? No   Zostavax completed No   Shingrix Completed?: No.    Education has been provided regarding the importance of this vaccine. Patient has been advised to call insurance company to determine out of pocket expense if they have not yet received this vaccine. Advised may also receive vaccine at local pharmacy or Health Dept. Verbalized acceptance and understanding.  Screening Tests Health Maintenance  Topic Date Due   COVID-19 Vaccine (3 - Pfizer risk series) 07/23/2021 (Originally 05/26/2019)   Zoster Vaccines- Shingrix (1 of 2) 10/07/2021 (Originally 02/25/1964)   TETANUS/TDAP  06/20/2025 (Originally 06/22/2015)   INFLUENZA VACCINE  09/21/2021   Pneumonia Vaccine 37+ Years old  Completed   Hepatitis C Screening  Completed   HPV VACCINES  Aged Out   COLONOSCOPY (Pts 45-27yrs Insurance coverage will need to be confirmed)  Discontinued    Health Maintenance  There are no preventive care reminders to display for this patient.   Colorectal cancer screening: No longer required.    Lung Cancer Screening: (Low Dose CT Chest recommended if Age 96-80 years, 30 pack-year currently smoking OR have quit w/in 15years.) does qualify.   Lung Cancer Screening Referral: patient will discuss with MD he had it scheduled but was called back saying Medicare does not cover after 70 without MD approval    Additional Screening:  Hepatitis C Screening: does not qualify; Completed 2017  Vision Screening: Recommended annual ophthalmology exams for early detection of glaucoma and other disorders of the eye. Is the patient up to date with their annual eye exam?  Yes  Who is the provider or what is the name of the office in which the patient attends annual eye exams? [facility] If pt is not established with a provider, would they like to be referred to a provider to establish care? No .   Dental Screening: Recommended annual dental exams for proper oral hygiene  Community Resource Referral / Chronic Care Management: CRR required this visit?  No   CCM required this visit?  No      Plan:     I have personally reviewed and noted the following in the patient's chart:   Medical and social history Use of alcohol, tobacco or illicit drugs  Current medications and supplements including opioid prescriptions. Patient is not currently taking opioid prescriptions. Functional ability and status Nutritional status Physical activity Advanced directives List of other physicians Hospitalizations, surgeries, and ER visits in previous 12 months Vitals Screenings to include cognitive, depression, and falls Referrals and appointments  In addition, I have reviewed and discussed with patient certain preventive protocols, quality metrics, and best practice recommendations. A written personalized care plan for preventive services as well as general preventive health recommendations were provided to patient.     Leroy Kennedy, LPN   10/03/7515   Nurse Notes:

## 2021-07-12 ENCOUNTER — Encounter: Payer: Self-pay | Admitting: Family Medicine

## 2021-07-12 ENCOUNTER — Ambulatory Visit (INDEPENDENT_AMBULATORY_CARE_PROVIDER_SITE_OTHER): Payer: Medicare Other | Admitting: Family Medicine

## 2021-07-12 VITALS — BP 136/76 | HR 55 | Temp 97.7°F | Ht 70.5 in | Wt 162.4 lb

## 2021-07-12 DIAGNOSIS — E785 Hyperlipidemia, unspecified: Secondary | ICD-10-CM

## 2021-07-12 DIAGNOSIS — Z7189 Other specified counseling: Secondary | ICD-10-CM | POA: Diagnosis not present

## 2021-07-12 DIAGNOSIS — F17200 Nicotine dependence, unspecified, uncomplicated: Secondary | ICD-10-CM

## 2021-07-12 DIAGNOSIS — J449 Chronic obstructive pulmonary disease, unspecified: Secondary | ICD-10-CM

## 2021-07-12 DIAGNOSIS — N400 Enlarged prostate without lower urinary tract symptoms: Secondary | ICD-10-CM

## 2021-07-12 DIAGNOSIS — R972 Elevated prostate specific antigen [PSA]: Secondary | ICD-10-CM | POA: Diagnosis not present

## 2021-07-12 DIAGNOSIS — I700 Atherosclerosis of aorta: Secondary | ICD-10-CM

## 2021-07-12 LAB — PSA: PSA: 2.42 ng/mL (ref 0.10–4.00)

## 2021-07-12 NOTE — Assessment & Plan Note (Signed)
Update PSA. Continues seeing urology.

## 2021-07-12 NOTE — Assessment & Plan Note (Addendum)
Advanced directives: has living will at home. HCPOA would be wife then oldest son. Copy in chart (2016), no HCPOA form. Form provided previously - he will bring in.

## 2021-07-12 NOTE — Patient Instructions (Signed)
Pass by lab for a PSA blood test You are doing well today.  Let us know if you'd like any assistance with quitting smoking.  Return as needed or in 1 year for next wellness visit.   Health Maintenance After Age 78 After age 8, you are at a higher risk for certain long-term diseases and infections as well as injuries from falls. Falls are a major cause of broken bones and head injuries in people who are older than age 80. Getting regular preventive care can help to keep you healthy and well. Preventive care includes getting regular testing and making lifestyle changes as recommended by your health care provider. Talk with your health care provider about: Which screenings and tests you should have. A screening is a test that checks for a disease when you have no symptoms. A diet and exercise plan that is right for you. What should I know about screenings and tests to prevent falls? Screening and testing are the best ways to find a health problem early. Early diagnosis and treatment give you the best chance of managing medical conditions that are common after age 46. Certain conditions and lifestyle choices may make you more likely to have a fall. Your health care provider may recommend: Regular vision checks. Poor vision and conditions such as cataracts can make you more likely to have a fall. If you wear glasses, make sure to get your prescription updated if your vision changes. Medicine review. Work with your health care provider to regularly review all of the medicines you are taking, including over-the-counter medicines. Ask your health care provider about any side effects that may make you more likely to have a fall. Tell your health care provider if any medicines that you take make you feel dizzy or sleepy. Strength and balance checks. Your health care provider may recommend certain tests to check your strength and balance while standing, walking, or changing positions. Foot health exam. Foot pain  and numbness, as well as not wearing proper footwear, can make you more likely to have a fall. Screenings, including: Osteoporosis screening. Osteoporosis is a condition that causes the bones to get weaker and break more easily. Blood pressure screening. Blood pressure changes and medicines to control blood pressure can make you feel dizzy. Depression screening. You may be more likely to have a fall if you have a fear of falling, feel depressed, or feel unable to do activities that you used to do. Alcohol use screening. Using too much alcohol can affect your balance and may make you more likely to have a fall. Follow these instructions at home: Lifestyle Do not drink alcohol if: Your health care provider tells you not to drink. If you drink alcohol: Limit how much you have to: 0-1 drink a day for women. 0-2 drinks a day for men. Know how much alcohol is in your drink. In the [place] one drink equals one 12 oz bottle of beer (355 mL), one 5 oz glass of wine (148 mL), or one 1 oz glass of hard liquor (44 mL). Do not use any products that contain nicotine or tobacco. These products include cigarettes, chewing tobacco, and vaping devices, such as e-cigarettes. If you need help quitting, ask your health care provider. Activity  Follow a regular exercise program to stay fit. This will help you maintain your balance. Ask your health care provider what types of exercise are appropriate for you. If you need a cane or walker, use it as recommended by your health  care provider. Wear supportive shoes that have nonskid soles. Safety  Remove any tripping hazards, such as rugs, cords, and clutter. Install safety equipment such as grab bars in bathrooms and safety rails on stairs. Keep rooms and walkways well-lit. General instructions Talk with your health care provider about your risks for falling. Tell your health care provider if: You fall. Be sure to tell your health care provider about all falls,  even ones that seem minor. You feel dizzy, tiredness (fatigue), or off-balance. Take over-the-counter and prescription medicines only as told by your health care provider. These include supplements. Eat a healthy diet and maintain a healthy weight. A healthy diet includes low-fat dairy products, low-fat (lean) meats, and fiber from whole grains, beans, and lots of fruits and vegetables. Stay current with your vaccines. Schedule regular health, dental, and eye exams. Summary Having a healthy lifestyle and getting preventive care can help to protect your health and wellness after age 91. Screening and testing are the best way to find a health problem early and help you avoid having a fall. Early diagnosis and treatment give you the best chance for managing medical conditions that are more common for people who are older than age 54. Falls are a major cause of broken bones and head injuries in people who are older than age 105. Take precautions to prevent a fall at home. Work with your health care provider to learn what changes you can make to improve your health and wellness and to prevent falls. This information is not intended to replace advice given to you by your health care provider. Make sure you discuss any questions you have with your health care provider. Document Revised: 06/29/2020 Document Reviewed: 06/29/2020 Elsevier Patient Education  [employer].

## 2021-07-12 NOTE — Assessment & Plan Note (Signed)
Incidental finding on imaging. Pt largely asxs. Encouraged full smoking cessation.

## 2021-07-12 NOTE — Assessment & Plan Note (Signed)
Continues seeing urology on finasteride Junious Silk)

## 2021-07-12 NOTE — Assessment & Plan Note (Addendum)
Continue statin. 

## 2021-07-12 NOTE — Progress Notes (Signed)
Patient ID: Richard Powell, male    DOB: 1945-07-10, 76 y.o.   MRN: [id number]  This visit was conducted in person.  BP 136/76   Pulse (!) 55   Temp 97.7 F (36.5 C) (Temporal)   Ht 5' 10.5" (1.791 m)   Wt 162 lb 6 oz (73.7 kg)   SpO2 98%   BMI 22.97 kg/m   BP Readings from Last 3 Encounters:  07/12/21 136/76  01/12/21 (!) 144/66  07/03/20 140/82    CC: AMW f/u visit  Subjective:   HPI: Richard Powell is a 76 y.o. male presenting on 07/12/2021 for Annual Exam [facility] prt 2. )   Saw health advisor last week for medicare wellness visit. Note reviewed.    No results found.  Flowsheet Row Clinical Support from 07/07/2021 in [facility] [place]  PHQ-2 Total Score 0          07/07/2021    9:33 AM 07/03/2020    8:29 AM 04/22/2019    3:15 PM 04/11/2018   10:55 AM 04/07/2017   10:33 AM  Fall Risk   Falls in the past year? 0 0 0 0 No  Number falls in past yr: 0  0    Injury with Fall? 0  0    Risk for fall due to :   No Fall Risks    Follow up Falls evaluation completed;Education provided;Falls prevention discussed  Falls evaluation completed;Falls prevention discussed     Continues magnesium and zinc daily as well as vit D, coq10, MVI.  Sees urology yearly.  Just spent 2 wks in [place] with brother (working on [employer]).   Preventative: COLONOSCOPY 12/2020 - TAx3, severe diverticulosis, fair colon prep - discuss 1 yr regarding rpt (Pyrtle)  Prostate cancer screening - elevated PSA followed by Dr Junious Silk ([place]). No fmhx prostate cancer. ++BPH sxs with nocturia. Had reassuring prostate MRI then recent biopsy  Lung cancer screening - LRCT lung scan yearly - stable. first CT was 2015, latest 12/2019.  Flu shot yearly  [place] 03/2019, 04/2019, no booster. Notes he lost taste after COVID shots.  Pneumovax - 2013. Prevnar-13 2016.  Td 2007  Zostavax - declines. Did not have chicken pox.  Shingrix - discussed  Advanced directives: has living will at  home. HCPOA would be wife then oldest son. Copy in chart (2016), no HCPOA form. Form provided previously - he will bring in.  Seat belt use discussed  Sunscreen use discussed. No changing moles on skin. Using wide brimmed hat. Saw Dr Nevada Crane for AKs.  Smoking 1 ppd x 50+ yrs - currently 3/4 ppd. Wife also smokes. Contemplative.  Alcohol occasionally on weekends  Dentist q6 mo - has partial dentures  Eye exam yearly -cataract surgery 07/2016 Bowel - no diarrhea/constipation  Bladder - no incontinence    Married, lives with wife  Children 3- out of home  [employer] - served in air [employer]  Occupation: Engineer, civil (consulting) at [employer], retired June 2015 Activity: stays active in the yard, walking more, golfing 3x/wk Diet: some water, fruits/vegetables daily      Relevant past medical, surgical, family and social history reviewed and updated as indicated. Interim medical history since our last visit reviewed. Allergies and medications reviewed and updated. Outpatient Medications Prior to Visit  Medication Sig Dispense Refill   atorvastatin (LIPITOR) 20 MG tablet TAKE 1 TABLET DAILY 90 tablet 0   Coenzyme Q10 (CO Q 10 PO) Take 200 mg by  mouth daily.     finasteride (PROSCAR) 5 MG tablet Take 5 mg by mouth at bedtime.      Multiple Vitamins-Minerals (MULTIVITAMIN PO) Take 1 tablet by mouth at bedtime.      VITAMIN D, CHOLECALCIFEROL, PO Take 2,000 Units by mouth daily.     Zinc Acetate, Oral, (ZINC ACETATE PO) Take 1 tablet by mouth daily.     No facility-administered medications prior to visit.     Per HPI unless specifically indicated in ROS section below Review of Systems  Objective:  BP 136/76   Pulse (!) 55   Temp 97.7 F (36.5 C) (Temporal)   Ht 5' 10.5" (1.791 m)   Wt 162 lb 6 oz (73.7 kg)   SpO2 98%   BMI 22.97 kg/m   Wt Readings from Last 3 Encounters:  07/12/21 162 lb 6 oz (73.7 kg)  01/12/21 170 lb (77.1 kg)  12/11/20 170 lb (77.1 kg)      Physical Exam Vitals and nursing note  reviewed.  Constitutional:      General: He is not in acute distress.    Appearance: Normal appearance. He is well-developed. He is not ill-appearing.  HENT:     Head: Normocephalic and atraumatic.     Right Ear: Hearing, tympanic membrane, ear canal and external ear normal.     Left Ear: Hearing, tympanic membrane, ear canal and external ear normal.  Eyes:     General: No scleral icterus.    Extraocular Movements: Extraocular movements intact.     Conjunctiva/sclera: Conjunctivae normal.     Pupils: Pupils are equal, round, and reactive to light.  Neck:     Thyroid: No thyroid mass or thyromegaly.     Vascular: No carotid bruit.  Cardiovascular:     Rate and Rhythm: Normal rate and regular rhythm.     Pulses: Normal pulses.          Radial pulses are 2+ on the right side and 2+ on the left side.     Heart sounds: Normal heart sounds. No murmur heard. Pulmonary:     Effort: Pulmonary effort is normal. No respiratory distress.     Breath sounds: Normal breath sounds. No wheezing, rhonchi or rales.  Abdominal:     General: Bowel sounds are normal. There is no distension.     Palpations: Abdomen is soft. There is no mass.     Tenderness: There is no abdominal tenderness. There is no guarding or rebound.     Hernia: No hernia is present.  Musculoskeletal:        General: Normal range of motion.     Cervical back: Normal range of motion and neck supple.     Right lower leg: No edema.     Left lower leg: No edema.  Lymphadenopathy:     Cervical: No cervical adenopathy.  Skin:    General: Skin is warm and dry.     Findings: No rash.  Neurological:     General: No focal deficit present.     Mental Status: He is alert and oriented to person, place, and time.  Psychiatric:        Mood and Affect: Mood normal.        Behavior: Behavior normal.        Thought Content: Thought content normal.        Judgment: Judgment normal.      Results for orders placed or performed in visit on  07/05/21  Comprehensive metabolic  panel  Result Value Ref Range   Sodium 139 135 - 145 mEq/L   Potassium 4.2 3.5 - 5.1 mEq/L   Chloride 106 96 - 112 mEq/L   CO2 24 19 - 32 mEq/L   Glucose, Bld 100 (H) 70 - 99 mg/dL   BUN 21 6 - 23 mg/dL   Creatinine, Ser 0.95 0.40 - 1.50 mg/dL   Total Bilirubin 0.8 0.2 - 1.2 mg/dL   Alkaline Phosphatase 86 39 - 117 U/L   AST 21 0 - 37 U/L   ALT 20 0 - 53 U/L   Total Protein 7.1 6.0 - 8.3 g/dL   Albumin 4.3 3.5 - 5.2 g/dL   GFR 76.75 >60.00 mL/min   Calcium 9.3 8.4 - 10.5 mg/dL  Lipid panel  Result Value Ref Range   Cholesterol 111 0 - 200 mg/dL   Triglycerides 74.0 0.0 - 149.0 mg/dL   HDL 48.00 >39.00 mg/dL   VLDL 14.8 0.0 - 40.0 mg/dL   LDL Cholesterol 48 0 - 99 mg/dL   Total CHOL/HDL Ratio 2    NonHDL 62.61     Assessment & Plan:   Problem List Items Addressed This Visit     Advanced care planning/counseling discussion - Primary (Chronic)    Advanced directives: has living will at home. HCPOA would be wife then oldest son. Copy in chart (2016), no HCPOA form. Form provided previously - he will bring in.       HLD (hyperlipidemia)    Chronic, stable on atorvastatin. The ASCVD Risk score (Arnett DK, et al., 2019) failed to calculate for the following reasons:   The valid total cholesterol range is 130 to 320 mg/dL        Smoker    Encouraged full cessation.  He has aged out of lung cancer screening (medicare coverage)       ELEVATED PROSTATE SPECIFIC ANTIGEN    Update PSA. Continues seeing urology.        Benign prostatic hyperplasia    Continues seeing urology on finasteride Junious Silk)       Relevant Orders   PSA   COPD (chronic obstructive pulmonary disease) ([place])    Incidental finding on imaging. Pt largely asxs. Encouraged full smoking cessation.        Thoracic aorta atherosclerosis (HCC)    Continue statin.          No orders of the defined types were placed in this encounter.  Orders Placed This  Encounter  Procedures   PSA     Patient instructions: Pass by lab for a PSA blood test You are doing well today.  Let us know if you'd like any assistance with quitting smoking.  Return as needed or in 1 year for next wellness visit.   Follow up plan: Return in about 1 year (around 07/13/2022) for medicare wellness visit.  Ria Bush, MD

## 2021-07-12 NOTE — Assessment & Plan Note (Addendum)
Encouraged full cessation.  He has aged out of lung cancer screening (medicare coverage)

## 2021-07-12 NOTE — Assessment & Plan Note (Signed)
Chronic, stable on atorvastatin. The ASCVD Risk score (Arnett DK, et al., 2019) failed to calculate for the following reasons:   The valid total cholesterol range is 130 to 320 mg/dL

## 2021-09-27 ENCOUNTER — Other Ambulatory Visit: Payer: Self-pay | Admitting: Family Medicine

## 2022-02-28 ENCOUNTER — Encounter: Payer: Self-pay | Admitting: Internal Medicine

## 2022-03-16 DIAGNOSIS — R972 Elevated prostate specific antigen [PSA]: Secondary | ICD-10-CM | POA: Diagnosis not present

## 2022-03-16 DIAGNOSIS — R3915 Urgency of urination: Secondary | ICD-10-CM | POA: Diagnosis not present

## 2022-03-16 DIAGNOSIS — N401 Enlarged prostate with lower urinary tract symptoms: Secondary | ICD-10-CM | POA: Diagnosis not present

## 2022-07-01 DIAGNOSIS — Z961 Presence of intraocular lens: Secondary | ICD-10-CM | POA: Diagnosis not present

## 2022-07-01 DIAGNOSIS — H43813 Vitreous degeneration, bilateral: Secondary | ICD-10-CM | POA: Diagnosis not present

## 2022-09-10 IMAGING — CT CT CHEST LUNG CANCER SCREENING LOW DOSE W/O CM
2 of 5 series · 15 of 40 positions shown, 18 images · non-contrast
Comparison: 12/24/2018

CLINICAL DATA: 74-year-old male with 68 pack-year history of
smoking. Lung cancer screening.

EXAM:
CT CHEST WITHOUT CONTRAST LOW-DOSE FOR LUNG CANCER SCREENING
TECHNIQUE: Multidetector CT imaging of the chest was performed following the
standard protocol without IV contrast.

[Series 4: lung 1.00 br44 cor · coronal · 0.69mm/px · 3 of 354 slices shown]
[im 71/354  lung]
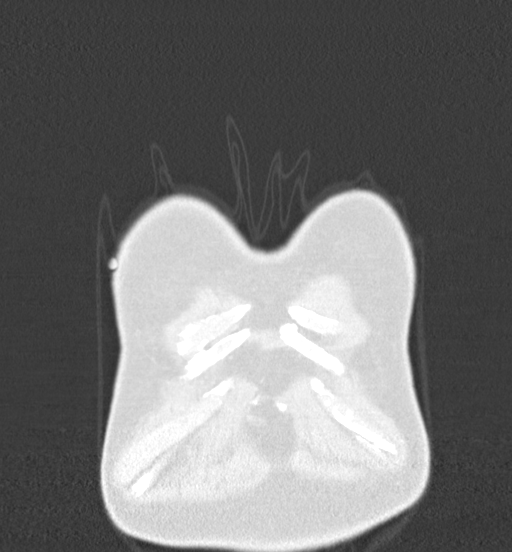
[im 142/354  lung]
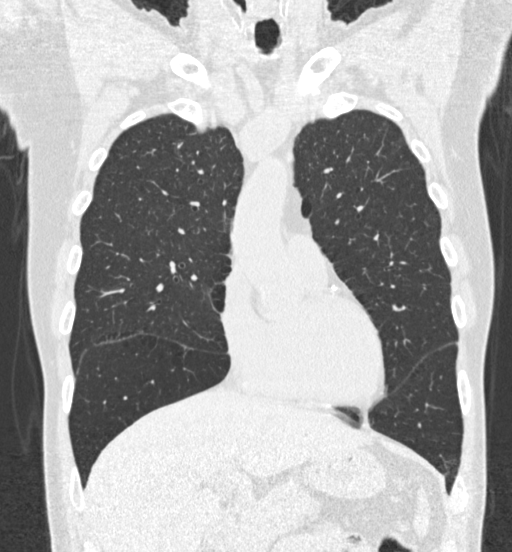
[im 212/354  lung]
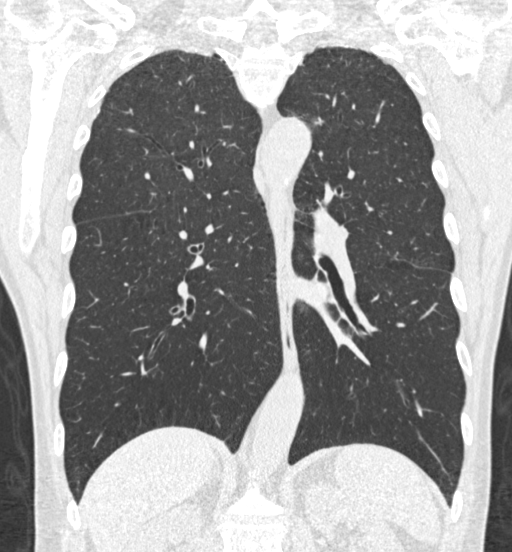

[Series 9: lung 1.00 br60 axial · axial · 0.69mm/px · z∈[-1249,-903]mm · 12 of 382 slices shown, 15 images]
[im 18/382  mediastinal]
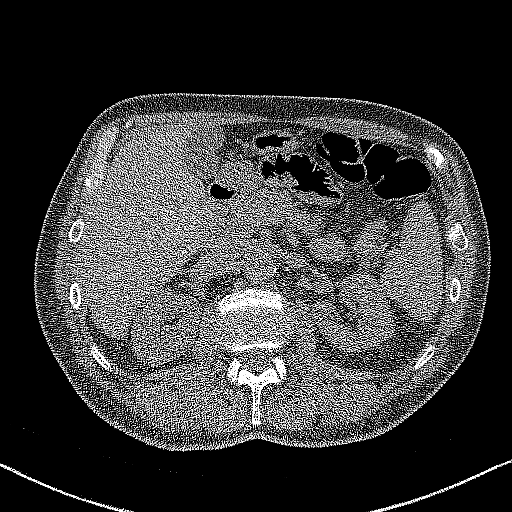
[im 18/382  lung]
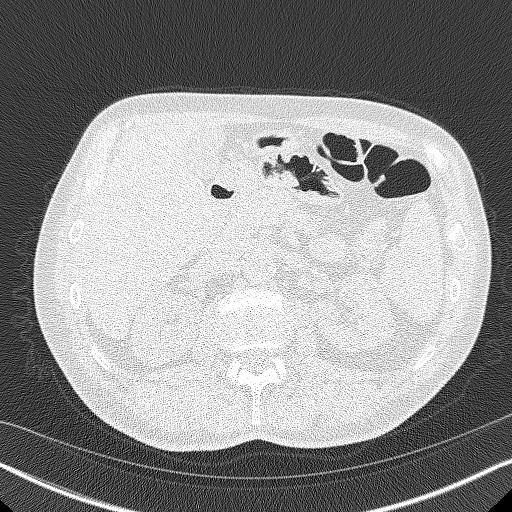
[im 52/382  lung]
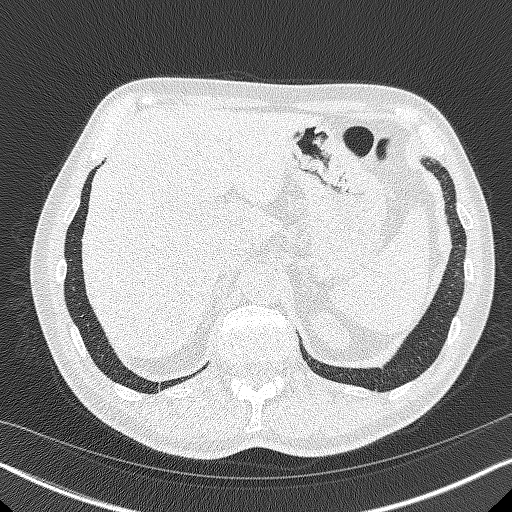
[im 87/382  lung]
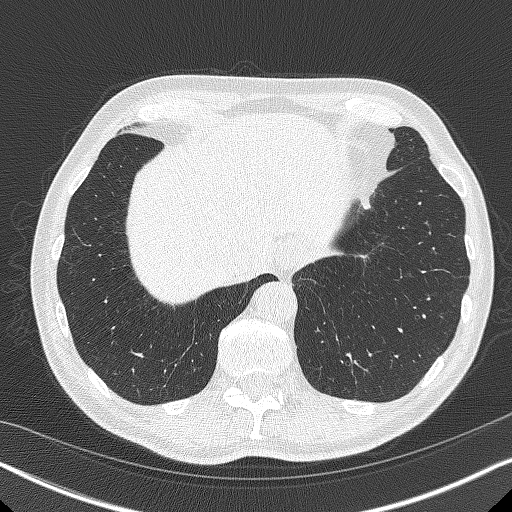
[im 122/382  lung]
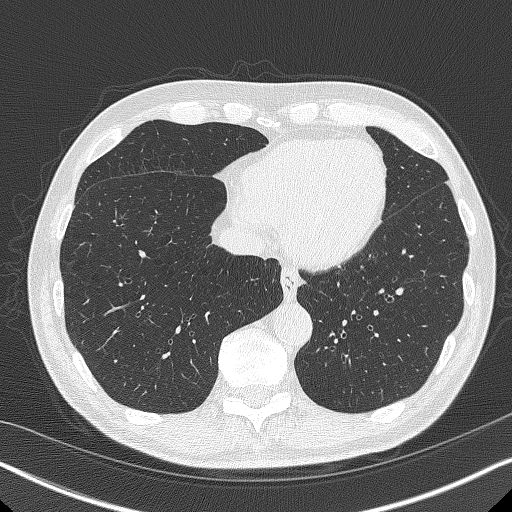
[im 139/382  mediastinal]
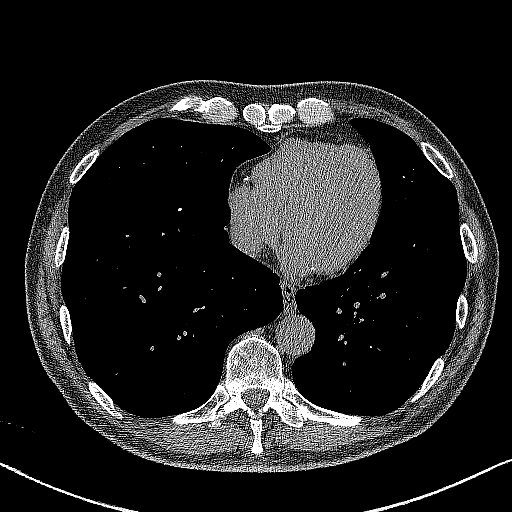
[im 139/382  lung]
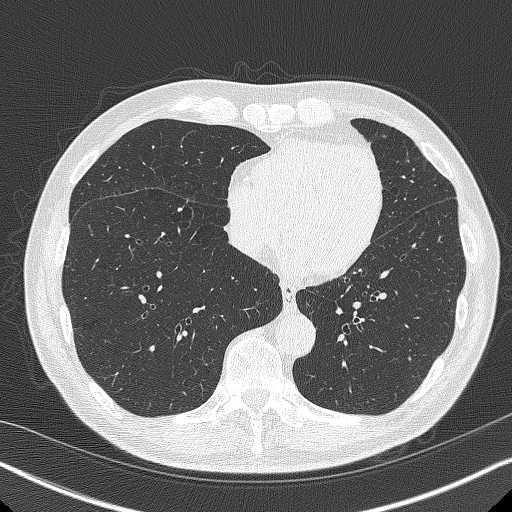
[im 174/382  lung]
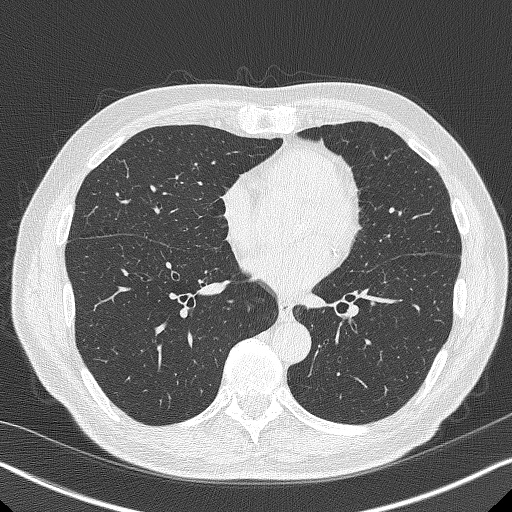
[im 208/382  lung]
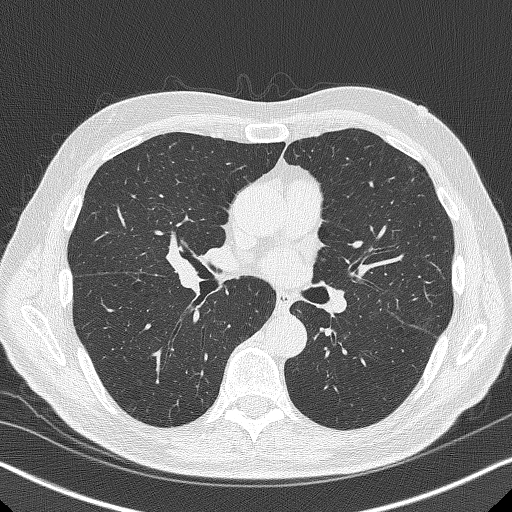
[im 243/382  lung]
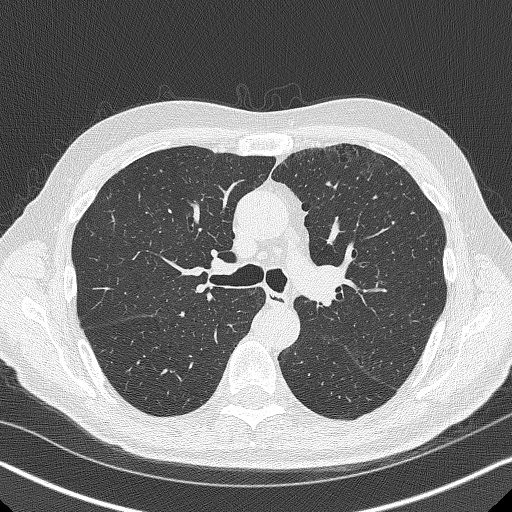
[im 260/382  mediastinal]
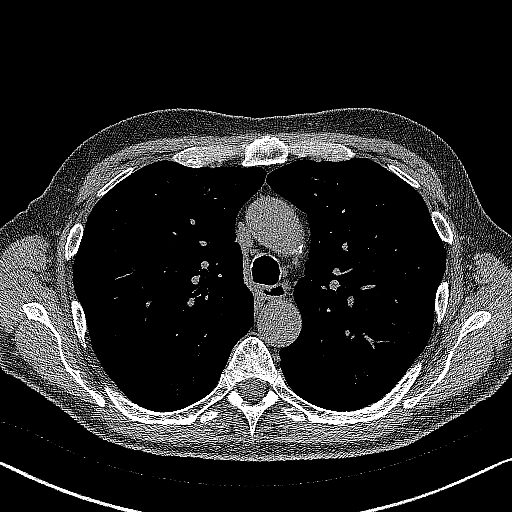
[im 260/382  lung]
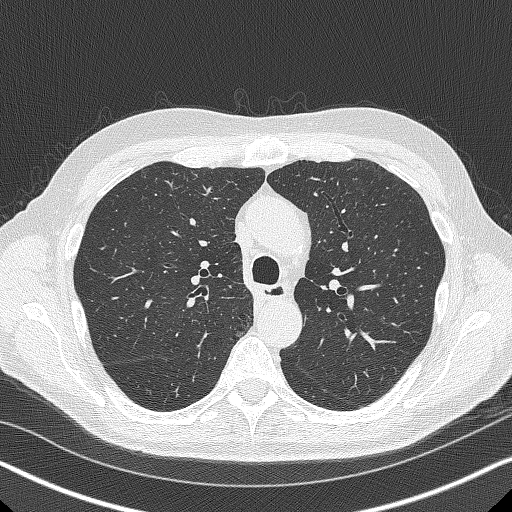
[im 295/382  lung]
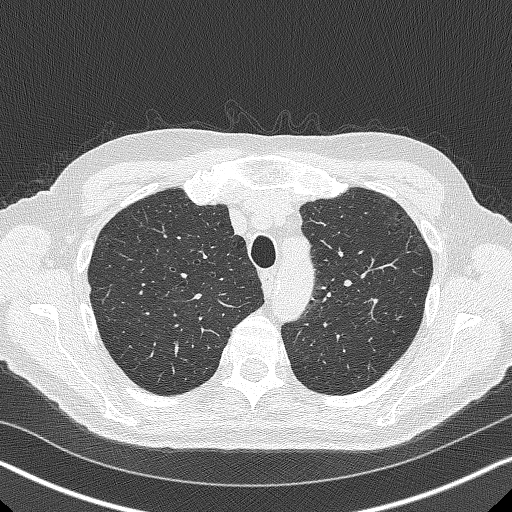
[im 330/382  lung]
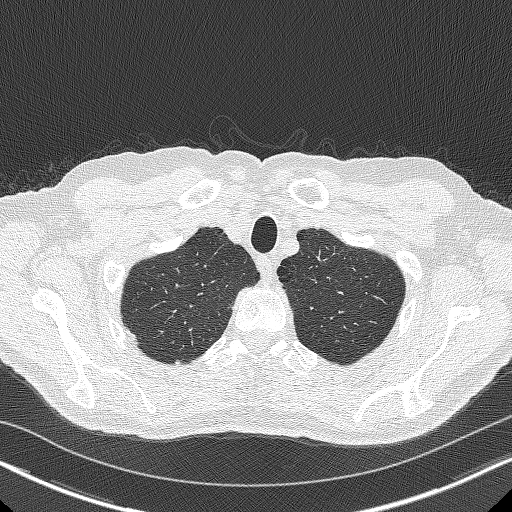
[im 364/382  lung]
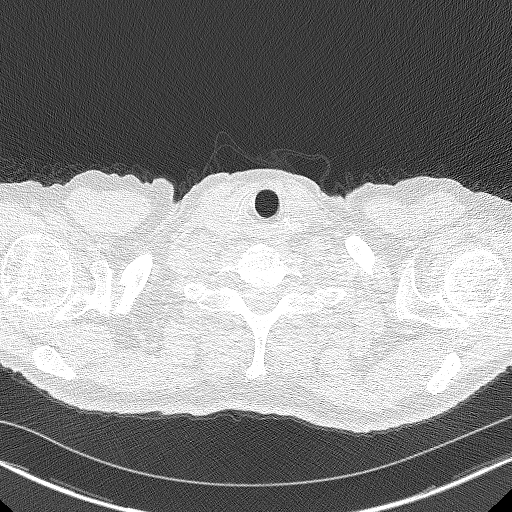

[15 of 40 positions shown; findings below may reference images not displayed]

FINDINGS: Cardiovascular: The heart size is normal. No substantial pericardial
effusion. Coronary artery calcification is evident. Atherosclerotic
calcification is noted in the wall of the thoracic aorta.

Mediastinum/Nodes: No mediastinal lymphadenopathy. No evidence for
gross hilar lymphadenopathy although assessment is limited by the
lack of intravenous contrast on today's study. The esophagus has
normal imaging features. There is no axillary lymphadenopathy.

Lungs/Pleura: Centrilobular emphsyema noted. Scattered tiny
bilateral pulmonary nodules identified previously are stable. No new
suspicious pulmonary nodule or mass. No focal airspace
consolidation. No pleural effusion.

Upper Abdomen: Unremarkable.

Musculoskeletal: No worrisome lytic or sclerotic osseous
abnormality.
IMPRESSION: 1. Lung-RADS 2, benign appearance or behavior. Continue annual
screening with low-dose chest CT without contrast in 12 months.
2.  Emphysema (97V97-4WJ.H) and Aortic Atherosclerosis (97V97-170.0)

## 2022-09-22 ENCOUNTER — Other Ambulatory Visit: Payer: Self-pay | Admitting: Family Medicine

## 2022-09-22 DIAGNOSIS — E785 Hyperlipidemia, unspecified: Secondary | ICD-10-CM

## 2022-09-22 NOTE — Telephone Encounter (Signed)
E-scribed refill  Plz schedule CPE and fasting lab (no food/drink- except water and/or blk coffee 5 hrs prior) visits for additional refills.  

## 2022-09-22 NOTE — Telephone Encounter (Signed)
Spoke to pt, scheduled cpe for 01/27/23

## 2022-12-21 ENCOUNTER — Other Ambulatory Visit: Payer: Self-pay | Admitting: Family Medicine

## 2022-12-21 DIAGNOSIS — E785 Hyperlipidemia, unspecified: Secondary | ICD-10-CM

## 2022-12-23 NOTE — Telephone Encounter (Signed)
Refill request for ATORVASTATIN TABS 20MG    LOV - 07/12/21 Next OV - 01/27/23 Last refill - 09/22/22 #90/0

## 2023-01-20 ENCOUNTER — Other Ambulatory Visit: Payer: Medicare Other

## 2023-01-21 ENCOUNTER — Other Ambulatory Visit: Payer: Self-pay | Admitting: Family Medicine

## 2023-01-21 DIAGNOSIS — E785 Hyperlipidemia, unspecified: Secondary | ICD-10-CM

## 2023-01-21 DIAGNOSIS — G4733 Obstructive sleep apnea (adult) (pediatric): Secondary | ICD-10-CM

## 2023-01-21 DIAGNOSIS — R972 Elevated prostate specific antigen [PSA]: Secondary | ICD-10-CM

## 2023-01-23 ENCOUNTER — Other Ambulatory Visit (INDEPENDENT_AMBULATORY_CARE_PROVIDER_SITE_OTHER): Payer: Medicare Other

## 2023-01-23 DIAGNOSIS — G4733 Obstructive sleep apnea (adult) (pediatric): Secondary | ICD-10-CM

## 2023-01-23 DIAGNOSIS — E785 Hyperlipidemia, unspecified: Secondary | ICD-10-CM | POA: Diagnosis not present

## 2023-01-23 DIAGNOSIS — R972 Elevated prostate specific antigen [PSA]: Secondary | ICD-10-CM

## 2023-01-23 LAB — CBC WITH DIFFERENTIAL/PLATELET
Basophils Absolute: 0.1 10*3/uL (ref 0.0–0.1)
Basophils Relative: 0.7 % (ref 0.0–3.0)
Eosinophils Absolute: 0.4 10*3/uL (ref 0.0–0.7)
Eosinophils Relative: 4.8 % (ref 0.0–5.0)
HCT: 48.9 % (ref 39.0–52.0)
Hemoglobin: 16.5 g/dL (ref 13.0–17.0)
Lymphocytes Relative: 26.9 % (ref 12.0–46.0)
Lymphs Abs: 2 10*3/uL (ref 0.7–4.0)
MCHC: 33.6 g/dL (ref 30.0–36.0)
MCV: 97.7 fL (ref 78.0–100.0)
Monocytes Absolute: 0.8 10*3/uL (ref 0.1–1.0)
Monocytes Relative: 11 % (ref 3.0–12.0)
Neutro Abs: 4.2 10*3/uL (ref 1.4–7.7)
Neutrophils Relative %: 56.6 % (ref 43.0–77.0)
Platelets: 189 10*3/uL (ref 150.0–400.0)
RBC: 5.01 Mil/uL (ref 4.22–5.81)
RDW: 12.5 % (ref 11.5–15.5)
WBC: 7.4 10*3/uL (ref 4.0–10.5)

## 2023-01-23 LAB — COMPREHENSIVE METABOLIC PANEL
ALT: 16 U/L (ref 0–53)
AST: 20 U/L (ref 0–37)
Albumin: 4.2 g/dL (ref 3.5–5.2)
Alkaline Phosphatase: 89 U/L (ref 39–117)
BUN: 22 mg/dL (ref 6–23)
CO2: 27 meq/L (ref 19–32)
Calcium: 9.8 mg/dL (ref 8.4–10.5)
Chloride: 103 meq/L (ref 96–112)
Creatinine, Ser: 1.05 mg/dL (ref 0.40–1.50)
GFR: 67.32 mL/min (ref >60.00)
Glucose, Bld: 92 mg/dL (ref 70–99)
Potassium: 4.7 meq/L (ref 3.5–5.1)
Sodium: 140 meq/L (ref 135–145)
Total Bilirubin: 0.7 mg/dL (ref 0.2–1.2)
Total Protein: 6.8 g/dL (ref 6.0–8.3)

## 2023-01-23 LAB — LIPID PANEL
Cholesterol: 129 mg/dL (ref 0–200)
HDL: 44.3 mg/dL (ref >39.00)
LDL Cholesterol: 66 mg/dL (ref 0–99)
NonHDL: 84.75
Total CHOL/HDL Ratio: 3
Triglycerides: 95 mg/dL (ref 0.0–149.0)
VLDL: 19 mg/dL (ref 0.0–40.0)

## 2023-01-23 LAB — PSA: PSA: 2.34 ng/mL (ref 0.10–4.00)

## 2023-01-23 LAB — TSH: TSH: 3.29 u[IU]/mL (ref 0.35–5.50)

## 2023-01-27 ENCOUNTER — Encounter: Payer: Self-pay | Admitting: Family Medicine

## 2023-01-27 ENCOUNTER — Ambulatory Visit: Payer: Medicare Other | Admitting: Family Medicine

## 2023-01-27 VITALS — BP 162/78 | HR 60 | Temp 97.6°F | Ht 70.0 in | Wt 166.4 lb

## 2023-01-27 DIAGNOSIS — J449 Chronic obstructive pulmonary disease, unspecified: Secondary | ICD-10-CM | POA: Diagnosis not present

## 2023-01-27 DIAGNOSIS — Z7189 Other specified counseling: Secondary | ICD-10-CM | POA: Diagnosis not present

## 2023-01-27 DIAGNOSIS — R972 Elevated prostate specific antigen [PSA]: Secondary | ICD-10-CM

## 2023-01-27 DIAGNOSIS — Z0001 Encounter for general adult medical examination with abnormal findings: Secondary | ICD-10-CM | POA: Diagnosis not present

## 2023-01-27 DIAGNOSIS — I700 Atherosclerosis of aorta: Secondary | ICD-10-CM

## 2023-01-27 DIAGNOSIS — E785 Hyperlipidemia, unspecified: Secondary | ICD-10-CM

## 2023-01-27 DIAGNOSIS — H6123 Impacted cerumen, bilateral: Secondary | ICD-10-CM

## 2023-01-27 DIAGNOSIS — F17200 Nicotine dependence, unspecified, uncomplicated: Secondary | ICD-10-CM | POA: Diagnosis not present

## 2023-01-27 DIAGNOSIS — N400 Enlarged prostate without lower urinary tract symptoms: Secondary | ICD-10-CM

## 2023-01-27 DIAGNOSIS — Z0000 Encounter for general adult medical examination without abnormal findings: Secondary | ICD-10-CM

## 2023-01-27 DIAGNOSIS — R030 Elevated blood-pressure reading, without diagnosis of hypertension: Secondary | ICD-10-CM

## 2023-01-27 MED ORDER — ATORVASTATIN CALCIUM 20 MG PO TABS: 20.0000 mg | ORAL_TABLET | Freq: Every day | ORAL | 4 refills | Status: DC

## 2023-01-27 MED ORDER — NICOTINE 14 MG/24HR TD PT24: 14.0000 mg | MEDICATED_PATCH | Freq: Every day | TRANSDERMAL | 1 refills | Status: DC

## 2023-01-27 NOTE — Assessment & Plan Note (Signed)
Chronic, stable period on atorvastatin - continue. The ASCVD Risk score (Arnett DK, et al., 2019) failed to calculate for the following reasons:   The valid total cholesterol range is 130 to 320 mg/dL  

## 2023-01-27 NOTE — Assessment & Plan Note (Signed)
Regularly sees urology for elevated PSA in h/o BPH.  Will f/u with [facility].

## 2023-01-27 NOTE — Assessment & Plan Note (Addendum)

## 2023-01-27 NOTE — Assessment & Plan Note (Signed)
Continue statin. 

## 2023-01-27 NOTE — Assessment & Plan Note (Signed)
Advanced directives: has living will at home. HCPOA would be wife then oldest son. Copy in chart (2016), no HCPOA form. Form provided previously - he needs to bring in.

## 2023-01-27 NOTE — Assessment & Plan Note (Signed)
S/p successful R ear irrigation, persistent cerumen impaction to L ear.  Offered return for rpt vs ENT referral. He will work on this at home with OTC debrox or similar.

## 2023-01-27 NOTE — Patient Instructions (Addendum)
You may call [facility] to schedule an appointment at [phone] to discuss repeat colonoscopy need.  Advanced directive packet provided today. Bring [place] updated copy to update your chart.  Continue working on smoking cessation. Let us know if you'd like assistance. Look into nicotine replacement therapy over the counter (14mg  patch nicoderm).  Return as needed or in 1 year for next medicare wellness visit.

## 2023-01-27 NOTE — Progress Notes (Signed)
Ph: [phone] Fax: [phone]   Patient ID: Richard Powell, male    DOB: 24-Oct-1945, 77 y.o.   MRN: [id number]  This visit was conducted in person.  BP (!) 162/78   Pulse 60   Temp 97.6 F (36.4 C) (Oral)   Ht 5\' 10"  (1.778 m)   Wt 166 lb 6 oz (75.5 kg)   SpO2 97%   BMI 23.87 kg/m   BP Readings from Last 3 Encounters:  01/27/23 (!) 162/78  07/12/21 136/76  01/12/21 (!) 144/66    CC: CPE Subjective:   HPI: [name] is a 77 y.o. male presenting on 01/27/2023 for Medicare Wellness   Did not see health advisor this year.   Hearing Screening   500Hz  1000Hz  2000Hz  4000Hz   Right ear 25 40 20 0  Left ear 40 0 25 0  Vision Screening - Comments:: Last eye exam, Apr or May 2023.  Flowsheet Row Office Visit from 01/27/2023 in [facility] [place]  PHQ-2 Total Score 0          01/27/2023   10:43 AM 07/07/2021    9:33 AM 07/03/2020    8:29 AM 04/22/2019    3:15 PM 04/11/2018   10:55 AM  Fall Risk   Falls in the past year? 0 0 0 0 0  Number falls in past yr:  0  0   Injury with Fall?  0  0   Risk for fall due to :    No Fall Risks   Follow up  Falls evaluation completed;Education provided;Falls prevention discussed  Falls evaluation completed;Falls prevention discussed    BP elevated today - no known h/o HTN.   Sees urology yearly on finasteride daily for BPH.    Preventative: COLONOSCOPY 12/2020 - TAx3, severe diverticulosis, fair colon prep - discuss 1 yr regarding rpt (Pyrtle) - overdue for this. # provided to call and schedule.  Prostate cancer screening - elevated PSA followed by Dr Mena Goes yearly. No fmhx prostate cancer. ++BPH sxs with nocturia. Had reassuring prostate MRI then recent biopsy  Lung cancer screening - LRCT lung scan yearly. first CT was 2015, latest 12/2019. Aged out.  Flu shot yearly  COVID vaccine Pfizer 03/2019, 04/2019, no booster. Notes he lost taste after COVID shots.  Pneumovax - 2013. Prevnar-13 2016.   Td 2007  Shingrix - discussed, declines. Never had chicken pox.  Advanced directives: has living will at home. HCPOA would be wife then oldest son. Copy in chart (2016), no HCPOA form. Packet provided again today - he will update  Seat belt use discussed  Sunscreen use discussed. No changing moles on skin. Using wide brimmed hat. Saw Dr Margo Aye for AKs  Smoking 1 ppd x 50+ yrs - currently 3/4 ppd. Wife also smokes. Contemplative.  Alcohol - occasionally  Dentist q6 mo - has partial dentures  Eye exam yearly  Bowel - no constipation  Bladder - no incontinence   Married, lives with wife  Children 3- out of home  [employer] - served in air [employer]  Occupation: Research scientist (physical sciences) at [employer], retired June 2015 Activity: stays active in the yard, walking more, golfing 3x/wk Diet: some water, fruits/vegetables daily      Relevant past medical, surgical, family and social history reviewed and updated as indicated. Interim medical history since our last visit reviewed. Allergies and medications reviewed and updated. Outpatient Medications Prior to Visit  Medication Sig Dispense Refill   Coenzyme Q10 (CO Q  10 PO) Take 200 mg by mouth daily.     finasteride (PROSCAR) 5 MG tablet Take 5 mg by mouth at bedtime.      Multiple Vitamins-Minerals (MULTIVITAMIN PO) Take 1 tablet by mouth at bedtime.      VITAMIN D, CHOLECALCIFEROL, PO Take 2,000 Units by mouth daily.     atorvastatin (LIPITOR) 20 MG tablet TAKE 1 TABLET DAILY (NEED ANNUAL PHYSICAL APPOINTMENT FOR ADDITIONAL REFILLS) 90 tablet 3   Zinc Acetate, Oral, (ZINC ACETATE PO) Take 1 tablet by mouth daily.     No facility-administered medications prior to visit.     Per HPI unless specifically indicated in ROS section below Review of Systems  Objective:  BP (!) 162/78   Pulse 60   Temp 97.6 F (36.4 C) (Oral)   Ht 5\' 10"  (1.778 m)   Wt 166 lb 6 oz (75.5 kg)   SpO2 97%   BMI 23.87 kg/m   Wt Readings from Last 3 Encounters:  01/27/23 166 lb 6  oz (75.5 kg)  07/12/21 162 lb 6 oz (73.7 kg)  01/12/21 170 lb (77.1 kg)      Physical Exam Vitals and nursing note reviewed.  Constitutional:      General: He is not in acute distress.    Appearance: Normal appearance. He is well-developed. He is not ill-appearing.  HENT:     Head: Normocephalic and atraumatic.     Right Ear: Hearing, tympanic membrane, ear canal and external ear normal. There is impacted cerumen.     Left Ear: Hearing, tympanic membrane, ear canal and external ear normal. There is impacted cerumen.     Mouth/Throat:     Mouth: Mucous membranes are moist.     Pharynx: Oropharynx is clear. No oropharyngeal exudate or posterior oropharyngeal erythema.  Eyes:     General: No scleral icterus.    Extraocular Movements: Extraocular movements intact.     Conjunctiva/sclera: Conjunctivae normal.     Pupils: Pupils are equal, round, and reactive to light.  Neck:     Thyroid: No thyroid mass or thyromegaly.     Vascular: No carotid bruit.  Cardiovascular:     Rate and Rhythm: Normal rate and regular rhythm.     Pulses: Normal pulses.          Radial pulses are 2+ on the right side and 2+ on the left side.     Heart sounds: Normal heart sounds. No murmur heard. Pulmonary:     Effort: Pulmonary effort is normal. No respiratory distress.     Breath sounds: Normal breath sounds. No wheezing, rhonchi or rales.  Abdominal:     General: Bowel sounds are normal. There is no distension.     Palpations: Abdomen is soft. There is no mass.     Tenderness: There is no abdominal tenderness. There is no guarding or rebound.     Hernia: No hernia is present.  Musculoskeletal:        General: Normal range of motion.     Cervical back: Normal range of motion and neck supple.     Right lower leg: No edema.     Left lower leg: No edema.  Lymphadenopathy:     Cervical: No cervical adenopathy.  Skin:    General: Skin is warm and dry.     Findings: No rash.  Neurological:      General: No focal deficit present.     Mental Status: He is alert and oriented to person, place,  and time.     Comments:  Recall 3/3 Calculation DLROW 5/5  Psychiatric:        Mood and Affect: Mood normal.        Behavior: Behavior normal.        Thought Content: Thought content normal.        Judgment: Judgment normal.       Results for orders placed or performed in visit on 01/23/23  CBC with Differential/Platelet  Result Value Ref Range   WBC 7.4 4.0 - 10.5 K/uL   RBC 5.01 4.22 - 5.81 Mil/uL   Hemoglobin 16.5 13.0 - 17.0 g/dL   HCT 16.1 09.6 - 04.5 %   MCV 97.7 78.0 - 100.0 fl   MCHC 33.6 30.0 - 36.0 g/dL   RDW 40.9 81.1 - 91.4 %   Platelets 189.0 150.0 - 400.0 K/uL   Neutrophils Relative % 56.6 43.0 - 77.0 %   Lymphocytes Relative 26.9 12.0 - 46.0 %   Monocytes Relative 11.0 3.0 - 12.0 %   Eosinophils Relative 4.8 0.0 - 5.0 %   Basophils Relative 0.7 0.0 - 3.0 %   Neutro Abs 4.2 1.4 - 7.7 K/uL   Lymphs Abs 2.0 0.7 - 4.0 K/uL   Monocytes Absolute 0.8 0.1 - 1.0 K/uL   Eosinophils Absolute 0.4 0.0 - 0.7 K/uL   Basophils Absolute 0.1 0.0 - 0.1 K/uL  TSH  Result Value Ref Range   TSH 3.29 0.35 - 5.50 uIU/mL  PSA  Result Value Ref Range   PSA 2.34 0.10 - 4.00 ng/mL  Comprehensive metabolic panel  Result Value Ref Range   Sodium 140 135 - 145 mEq/L   Potassium 4.7 3.5 - 5.1 mEq/L   Chloride 103 96 - 112 mEq/L   CO2 27 19 - 32 mEq/L   Glucose, Bld 92 70 - 99 mg/dL   BUN 22 6 - 23 mg/dL   Creatinine, Ser 7.82 0.40 - 1.50 mg/dL   Total Bilirubin 0.7 0.2 - 1.2 mg/dL   Alkaline Phosphatase 89 39 - 117 U/L   AST 20 0 - 37 U/L   ALT 16 0 - 53 U/L   Total Protein 6.8 6.0 - 8.3 g/dL   Albumin 4.2 3.5 - 5.2 g/dL   GFR 95.62 >13.08 mL/min   Calcium 9.8 8.4 - 10.5 mg/dL  Lipid panel  Result Value Ref Range   Cholesterol 129 0 - 200 mg/dL   Triglycerides 65.7 0.0 - 149.0 mg/dL   HDL 84.69 >62.95 mg/dL   VLDL 28.4 0.0 - 13.2 mg/dL   LDL Cholesterol 66 0 - 99 mg/dL    Total CHOL/HDL Ratio 3    NonHDL 84.75     Assessment & Plan:   Problem List Items Addressed This Visit     Medicare annual wellness visit, subsequent - Primary (Chronic)    I have personally reviewed the Medicare Annual Wellness questionnaire and have noted 1. The patient's medical and social history 2. Their use of alcohol, tobacco or illicit drugs 3. Their current medications and supplements 4. The patient's functional ability including ADL's, fall risks, home safety risks and hearing or visual impairment. Cognitive function has been assessed and addressed as indicated.  5. Diet and physical activity 6. Evidence for depression or mood disorders The patients weight, height, BMI have been recorded in the chart. I have made referrals, counseling and provided education to the patient based on review of the above and I have provided the pt with a written  personalized care plan for preventive services. Provider list updated.. See scanned questionairre as needed for further documentation. Reviewed preventative protocols and updated unless pt declined.       Advanced care planning/counseling discussion (Chronic)    Advanced directives: has living will at home. HCPOA would be wife then oldest son. Copy in chart (2016), no HCPOA form. Form provided previously - he needs to bring in.       HLD (hyperlipidemia)    Chronic, stable period on atorvastatin - continue. The ASCVD Risk score (Arnett DK, et al., 2019) failed to calculate for the following reasons:   The valid total cholesterol range is 130 to 320 mg/dL       Relevant Medications   atorvastatin (LIPITOR) 20 MG tablet   Smoker    Continue to encourage full cessation.  Ready for action - discussed NRT vs wellbutrin vs chantix. He will start OTC nicotine patch.  He's aged out of lung cancer screening - last done 2021.       ELEVATED PROSTATE SPECIFIC ANTIGEN    Regularly sees urology for elevated PSA in h/o BPH.  Will f/u with  [facility].       Benign prostatic hyperplasia    Continues seeing urology on finasteride      COPD (chronic obstructive pulmonary disease) (HCC)    Asxs off respiratory medication. Encouraged full smoking cessation.       Relevant Medications   nicotine (NICODERM CQ) 14 mg/24hr patch   Hearing loss due to cerumen impaction    S/p successful R ear irrigation, persistent cerumen impaction to L ear.  Offered return for rpt vs ENT referral. He will work on this at home with OTC debrox or similar.       Thoracic aorta atherosclerosis (HCC)    Continue statin.       Relevant Medications   atorvastatin (LIPITOR) 20 MG tablet   Elevated blood pressure reading without diagnosis of hypertension    BP elevation noted today.  Recommend he monitor BP at home or local pharmacy and send me readings to review.  If persistently elevated, consider antihypertensive commencement.         Meds ordered this encounter  Medications   atorvastatin (LIPITOR) 20 MG tablet    Sig: Take 1 tablet (20 mg total) by mouth daily.    Dispense:  90 tablet    Refill:  4   nicotine (NICODERM CQ) 14 mg/24hr patch    Sig: Place 1 patch (14 mg total) onto the skin daily.    Dispense:  28 patch    Refill:  1    No orders of the defined types were placed in this encounter.   Patient Instructions  You may call [facility] to schedule an appointment at [phone] to discuss repeat colonoscopy need.  Advanced directive packet provided today. Bring [place] updated copy to update your chart.  Continue working on smoking cessation. Let us know if you'd like assistance. Look into nicotine replacement therapy over the counter (14mg  patch nicoderm).  Return as needed or in 1 year for next medicare wellness visit.   Follow up plan: Return in about 1 year (around 01/27/2024) for medicare wellness visit.  Eustaquio Boyden, MD

## 2023-01-27 NOTE — Assessment & Plan Note (Addendum)
Continue to encourage full cessation.  Ready for action - discussed NRT vs wellbutrin vs chantix. He will start OTC nicotine patch.  He's aged out of lung cancer screening - last done 2021.

## 2023-01-27 NOTE — Assessment & Plan Note (Signed)
Asxs off respiratory medication. Encouraged full smoking cessation.

## 2023-01-27 NOTE — Assessment & Plan Note (Signed)
Continues seeing urology on finasteride

## 2023-02-01 ENCOUNTER — Encounter: Payer: Self-pay | Admitting: Family Medicine

## 2023-02-01 DIAGNOSIS — R030 Elevated blood-pressure reading, without diagnosis of hypertension: Secondary | ICD-10-CM | POA: Insufficient documentation

## 2023-02-01 NOTE — Assessment & Plan Note (Signed)
BP elevation noted today.  Recommend he monitor BP at home or local pharmacy and send me readings to review.  If persistently elevated, consider antihypertensive commencement.

## 2023-04-05 ENCOUNTER — Encounter: Payer: Self-pay | Admitting: Family Medicine

## 2023-08-02 ENCOUNTER — Ambulatory Visit (INDEPENDENT_AMBULATORY_CARE_PROVIDER_SITE_OTHER): Admitting: Student

## 2023-08-02 ENCOUNTER — Encounter: Payer: Self-pay | Admitting: Family Medicine

## 2023-08-02 ENCOUNTER — Ambulatory Visit: Admitting: Internal Medicine

## 2023-08-02 VITALS — BP 130/86 | HR 50 | Temp 98.3°F | Resp 12 | Ht 70.0 in | Wt 164.6 lb

## 2023-08-02 DIAGNOSIS — H6123 Impacted cerumen, bilateral: Secondary | ICD-10-CM | POA: Insufficient documentation

## 2023-08-02 NOTE — Telephone Encounter (Signed)
 Unable to reach patient by phone and left v/m requesting call back at [phone] and also sent my chart note to call [phone]. Sending note to [facility] triage and also to Dr Mariam Shingles. Pl last seen 01/27/23 and pt was offered a repeat visit to try and remove wax Lt ear v/s ENT referral. At that time pt wanted to work on at his home.

## 2023-08-02 NOTE — Telephone Encounter (Signed)
 Per chart review tab pt was seen LBSW([place]) this afternoon. Sending FYI to Dr Crissie Dome.

## 2023-08-02 NOTE — Progress Notes (Signed)
 Subjective:     Patient ID: Richard Powell, male    DOB: 08-19-1945, 78 y.o.   MRN: [id number]  Chief Complaint  Patient presents with   Cerumen Impaction    Left ear      HPI  Richard Powell present for acute visit with bilateral cerumen impaction  Indication: Cerumen impaction of the ear(s)  Medical necessity statement: On physical examination, cerumen impairs clinically significant portions of the external auditory canal, and tympanic membrane. Noted obstructive, copious cerumen that cannot be removed without magnification and instrumentations requiring professional removal.   Consent: Discussed benefits and risks of procedure and verbal consent obtained  Procedure: Patient was prepped for the procedure. An otoscope was used to examine the ear canal and tympanic membrane, noting the presence, amount, and location of cerumen. Gentle irrigation was performed using a mixture of liquid hydrogen peroxide and water warmed to body temperature. A soft plastic curette was then used to carefully remove impacted cerumen. Excess water drained by gravity and ear canal(s) dried with clean guaze.  Post procedure examination: Otoscopic examination reveals complete cerumen removal with no damage to the auditory canal, tympanic membrane, or surrounding tissue.  Patient tolerated procedure well.     Patient denies fever, chills, SOB, CP, palpitations, dyspnea, edema, HA, vision changes, N/V/D, abdominal pain, urinary symptoms, rash, weight changes, and recent illness or hospitalizations.     History of Present Illness              Health Maintenance Due  Topic Date Due   Zoster Vaccines- Shingrix (1 of 2) Never done   COVID-19 Vaccine (3 - Pfizer risk series) 05/26/2019   Lung Cancer Screening  12/29/2020    Past Medical History:  Diagnosis Date   BPH (benign prostatic hypertrophy)    finasteride ([place])   CAD (coronary artery disease) 05/2015   3v CAD by CT scan    Cataract    COPD (chronic obstructive pulmonary disease) (HCC)    by screening CT scan   ED (erectile dysfunction) of organic origin    Elevated PSA    benign prostate biopsy 02/2012 ([place])   Hyperlipemia    mild   Sleep apnea    has cpap; uses occasionally.  uses nose strips    Past Surgical History:  Procedure Laterality Date   APPENDECTOMY  1973   CATARACT EXTRACTION W/PHACO Right 08/02/2016   Procedure: CATARACT EXTRACTION PHACO AND INTRAOCULAR LENS PLACEMENT (IOC);  Surgeon: Clair Crews, MD;  Location: [facility] ORS;  Service: Ophthalmology;  Laterality: Right;  US  01:23 AP% 18.0 CDE 14.92 Fluid pack lot # [id number]   CATARACT EXTRACTION W/PHACO Left 08/16/2016   Procedure: CATARACT EXTRACTION PHACO AND INTRAOCULAR LENS PLACEMENT (IOC);  Surgeon: Clair Crews, MD;  Location: [facility] ORS;  Service: Ophthalmology;  Laterality: Left;  US  01:20 AP% 24.7 CDE 19.76 Fluid pack lot # [id number]   COLONOSCOPY  01/21/2010   severe diverticulosis, 1 hyperplastic polyp, rpt 5 yrs [facility])   COLONOSCOPY  04/22/2015   hyperplastic polyps, severe diverticulosis, int hem, rpt 10 yrs ([facility])   COLONOSCOPY  12/2020   TAx3, severe diverticulosis, fair colon prep - discuss 1 yr regarding rpt ([facility])   EYE SURGERY  2019   cataract removal   HAND SURGERY Left 02/21/1958   VASECTOMY  02/22/1971    Family History  Problem Relation Age of Onset   Colon cancer Mother 68   Cancer Mother    Cancer Maternal Grandmother        ?  abd cancer   CAD Neg Hx    Stroke Neg Hx    Diabetes Neg Hx    Esophageal cancer Neg Hx    Stomach cancer Neg Hx    Rectal cancer Neg Hx     Social History   Socioeconomic History   Marital status: Married    Spouse name: Not on file   Number of children: 3   Years of education: Not on file   Highest education level: Master's degree (e.g., MA, MS, MEng, MEd, MSW, MBA)  Occupational History   Occupation: Agricultural consultant at [employer]    Employer: [employer]    Comment: computers  Tobacco Use   Smoking status: Every Day    Current packs/day: 1.00    Average packs/day: 1 pack/day for 54.0 years (54.0 ttl pk-yrs)    Types: Cigarettes   Smokeless tobacco: Never  Vaping Use   Vaping status: Never Used  Substance and Sexual Activity   Alcohol use: Yes    Alcohol/week: 1.0 standard drink of alcohol    Types: 1 Cans of beer per week    Comment: beer on the weekends   Drug use: No   Sexual activity: Not on file  Other Topics Concern   Not on file  Social History Narrative   Married, lives with wife   Children 3- out of home   [employer] - served in air [employer]    Occupation: Research scientist (physical sciences) at [employer], retired June 2015   Activity: stays active at home, walking more, golfing   Diet: some water, fruits/vegetables daily   Social Drivers of Corporate investment banker Strain: Low Risk  (08/02/2023)   Overall Financial Resource Strain (CARDIA)    Difficulty of Paying Living Expenses: Not hard at all  Food Insecurity: No Food Insecurity (08/02/2023)   Hunger Vital Sign    Worried About Running Out of Food in the Last Year: Never true    Ran Out of Food in the Last Year: Never true  Transportation Needs: No Transportation Needs (08/02/2023)   PRAPARE - Administrator, Civil Service (Medical): No    Lack of Transportation (Non-Medical): No  Physical Activity: Sufficiently Active (08/02/2023)   Exercise Vital Sign    Days of Exercise per Week: 4 days    Minutes of Exercise per Session: 150+ min  Stress: No Stress Concern Present (08/02/2023)   [employer] of Occupational Health - Occupational Stress Questionnaire    Feeling of Stress : Not at all  Social Connections: Unknown (08/02/2023)   Social Connection and Isolation Panel [NHANES]    Frequency of Communication with Friends and Family: More than three times a week    Frequency of Social Gatherings with Friends and Family: More than three times a week    Attends Religious  Services: Patient declined    Database administrator or Organizations: Yes    Attends Engineer, structural: More than 4 times per year    Marital Status: Married  Catering manager Violence: Not At Risk (07/07/2021)   Humiliation, Afraid, Rape, and Kick questionnaire    Fear of Current or Ex-Partner: No    Emotionally Abused: No    Physically Abused: No    Sexually Abused: No    Outpatient Medications Prior to Visit  Medication Sig Dispense Refill   atorvastatin  (LIPITOR) 20 MG tablet Take 1 tablet (20 mg total) by mouth daily. 90 tablet 4   Coenzyme Q10 (CO Q 10  PO) Take 200 mg by mouth daily.     finasteride (PROSCAR) 5 MG tablet Take 5 mg by mouth at bedtime.      Multiple Vitamins-Minerals (MULTIVITAMIN PO) Take 1 tablet by mouth at bedtime.      VITAMIN D, CHOLECALCIFEROL, PO Take 2,000 Units by mouth daily.     nicotine  (NICODERM CQ ) 14 mg/24hr patch Place 1 patch (14 mg total) onto the skin daily. 28 patch 1   No facility-administered medications prior to visit.    No Known Allergies  ROS    See HPI Objective:     Physical Exam  General: No acute distress. Awake and conversant.  ENT:  +bilateral ear cerumen impaction, unable to visualize L/R TM Neck: Neck is supple. No masses or thyromegaly.  Respiratory:  Respirations are non-labored. No wheezing.  Psych: Alert and oriented. Cooperative, Appropriate mood and affect, Normal judgment.  MSK: Normal ambulation.  Neuro:  CN II-XII grossly normal.     BP 130/86 (BP Location: Left Arm, Patient Position: Sitting, Cuff Size: Normal)   Pulse (!) 50   Temp 98.3 F (36.8 C) (Oral)   Resp 12   Ht 5' 10 (1.778 m)   Wt 164 lb 9.6 oz (74.7 kg)   SpO2 95%   BMI 23.62 kg/m  Wt Readings from Last 3 Encounters:  08/02/23 164 lb 9.6 oz (74.7 kg)  01/27/23 166 lb 6 oz (75.5 kg)  07/12/21 162 lb 6 oz (73.7 kg)       Assessment & Plan:   Problem List Items Addressed This Visit     Bilateral impacted cerumen -  Primary   Patient tolerated procedure well.  Patient made aware that they may experience temporary vertigo, temporary changes in hearing, and temporary discomfort. If these symptom last for more than 24 hours to call the clinic or proceed to the ED for further evaluation. Discussed avoiding placing objects into the ear canal for cleaning.       I have discontinued Alpha Jarred. Ostlund's nicotine . I am also having him maintain his Multiple Vitamins-Minerals (MULTIVITAMIN PO), finasteride, (VITAMIN D, CHOLECALCIFEROL, PO), Coenzyme Q10 (CO Q 10 PO), and atorvastatin .  No orders of the defined types were placed in this encounter.

## 2023-08-02 NOTE — Assessment & Plan Note (Signed)
 Patient tolerated procedure well.  Patient made aware that they may experience temporary vertigo, temporary changes in hearing, and temporary discomfort. If these symptom last for more than 24 hours to call the clinic or proceed to the ED for further evaluation. Discussed avoiding placing objects into the ear canal for cleaning.

## 2023-08-04 NOTE — Telephone Encounter (Signed)
 Noted thanks

## 2023-10-06 DIAGNOSIS — R972 Elevated prostate specific antigen [PSA]: Secondary | ICD-10-CM | POA: Diagnosis not present

## 2023-10-06 DIAGNOSIS — N401 Enlarged prostate with lower urinary tract symptoms: Secondary | ICD-10-CM | POA: Diagnosis not present

## 2023-10-06 DIAGNOSIS — R351 Nocturia: Secondary | ICD-10-CM | POA: Diagnosis not present

## 2024-01-20 ENCOUNTER — Other Ambulatory Visit: Payer: Self-pay | Admitting: Family Medicine

## 2024-01-20 DIAGNOSIS — E785 Hyperlipidemia, unspecified: Secondary | ICD-10-CM

## 2024-01-24 ENCOUNTER — Other Ambulatory Visit (INDEPENDENT_AMBULATORY_CARE_PROVIDER_SITE_OTHER): Payer: Medicare Other

## 2024-01-24 DIAGNOSIS — R972 Elevated prostate specific antigen [PSA]: Secondary | ICD-10-CM

## 2024-01-24 DIAGNOSIS — E785 Hyperlipidemia, unspecified: Secondary | ICD-10-CM | POA: Diagnosis not present

## 2024-01-24 DIAGNOSIS — N400 Enlarged prostate without lower urinary tract symptoms: Secondary | ICD-10-CM | POA: Diagnosis not present

## 2024-01-24 LAB — COMPREHENSIVE METABOLIC PANEL WITH GFR
ALT: 19 U/L (ref 0–53)
AST: 25 U/L (ref 0–37)
Albumin: 4.3 g/dL (ref 3.5–5.2)
Alkaline Phosphatase: 80 U/L (ref 39–117)
BUN: 18 mg/dL (ref 6–23)
CO2: 29 meq/L (ref 19–32)
Calcium: 9.8 mg/dL (ref 8.4–10.5)
Chloride: 103 meq/L (ref 96–112)
Creatinine, Ser: 1.03 mg/dL (ref 0.40–1.50)
GFR: 68.41 mL/min (ref >60.00)
Glucose, Bld: 89 mg/dL (ref 70–99)
Potassium: 4.3 meq/L (ref 3.5–5.1)
Sodium: 138 meq/L (ref 135–145)
Total Bilirubin: 1 mg/dL (ref 0.2–1.2)
Total Protein: 6.9 g/dL (ref 6.0–8.3)

## 2024-01-24 LAB — LIPID PANEL
Cholesterol: 124 mg/dL (ref 0–200)
HDL: 46.6 mg/dL (ref >39.00)
LDL Cholesterol: 61 mg/dL (ref 0–99)
NonHDL: 77.74
Total CHOL/HDL Ratio: 3
Triglycerides: 83 mg/dL (ref 0.0–149.0)
VLDL: 16.6 mg/dL (ref 0.0–40.0)

## 2024-01-24 LAB — PSA: PSA: 1.74 ng/mL (ref 0.10–4.00)

## 2024-01-24 NOTE — Addendum Note (Signed)
 Addended by: HOPE VEVA PARAS on: 01/24/2024 08:32 AM   Modules accepted: Orders

## 2024-01-25 ENCOUNTER — Ambulatory Visit: Payer: Self-pay | Admitting: Family Medicine

## 2024-01-31 ENCOUNTER — Ambulatory Visit: Payer: Medicare Other | Admitting: Family Medicine

## 2024-01-31 VITALS — BP 140/84 | HR 72 | Temp 97.9°F | Ht 70.0 in | Wt 164.8 lb

## 2024-01-31 DIAGNOSIS — F17200 Nicotine dependence, unspecified, uncomplicated: Secondary | ICD-10-CM | POA: Diagnosis not present

## 2024-01-31 DIAGNOSIS — Z0000 Encounter for general adult medical examination without abnormal findings: Secondary | ICD-10-CM | POA: Diagnosis not present

## 2024-01-31 DIAGNOSIS — Z7189 Other specified counseling: Secondary | ICD-10-CM

## 2024-01-31 DIAGNOSIS — J449 Chronic obstructive pulmonary disease, unspecified: Secondary | ICD-10-CM

## 2024-01-31 DIAGNOSIS — I700 Atherosclerosis of aorta: Secondary | ICD-10-CM

## 2024-01-31 DIAGNOSIS — R030 Elevated blood-pressure reading, without diagnosis of hypertension: Secondary | ICD-10-CM

## 2024-01-31 DIAGNOSIS — E785 Hyperlipidemia, unspecified: Secondary | ICD-10-CM | POA: Diagnosis not present

## 2024-01-31 DIAGNOSIS — N400 Enlarged prostate without lower urinary tract symptoms: Secondary | ICD-10-CM | POA: Diagnosis not present

## 2024-01-31 MED ORDER — ATORVASTATIN CALCIUM 20 MG PO TABS: 20.0000 mg | ORAL_TABLET | Freq: Every day | ORAL | 3 refills | Status: AC

## 2024-01-31 NOTE — Assessment & Plan Note (Signed)
 Continue statin

## 2024-01-31 NOTE — Assessment & Plan Note (Signed)

## 2024-01-31 NOTE — Progress Notes (Signed)
 Ph: [phone] Fax: [phone]   Patient ID: Richard Powell, male    DOB: 03-19-1945, 78 y.o.   MRN: [id number]  This visit was conducted in person.  BP (!) 140/84 (Cuff Size: Normal)   Pulse 72   Temp 97.9 F (36.6 C) (Oral)   Ht 5' 10 (1.778 m)   Wt 164 lb 12.8 oz (74.8 kg)   SpO2 100%   BMI 23.65 kg/m   BP Readings from Last 3 Encounters:  01/31/24 (!) 140/84  08/02/23 130/86  01/27/23 (!) 162/78  148/72 on repeat testing  CC: AMW Subjective:   HPI: Richard Powell is a 78 y.o. male presenting on 01/31/2024 for Annual Exam (Pt has had bout of congestion and sinus pressure that he states were giving him bad headaches, would like to discuss with you//Is also concerned about high BP //Fy- no longer qualify's for lung cancer screening with medicare after 76 so pt was not able to get done)   Did not see health advisor this year.   No results found.  Flowsheet Row Office Visit from 01/31/2024 in [facility] [place]  PHQ-2 Total Score 0       01/31/2024   10:07 AM 01/27/2023   10:43 AM 07/07/2021    9:33 AM 07/03/2020    8:29 AM 04/22/2019    3:15 PM  Fall Risk   Falls in the past year? 0 0 0 0 0   Number falls in past yr: 0  0  0  Injury with Fall? 0  0   0   Risk for fall due to :     No Fall Risks  Follow up Falls evaluation completed  Falls evaluation completed;Education provided;Falls prevention discussed   Falls evaluation completed;Falls prevention discussed      Data saved with a previous flowsheet row definition   BP running elevated. [facility] - 130/70s.   Recent URI - he is improving. Continued smoker - see below.   Sees urology yearly on finasteride daily for BPH.   Preventative: COLONOSCOPY 12/2020 - TAx3, severe diverticulosis, fair colon prep - discuss 1 yr regarding rpt (Richard Powell) - did not return for repeat. He will need to discuss with GI regarding repeat.  Prostate cancer screening - elevated PSA  followed by Richard Powell yearly. No fmhx prostate cancer. ++BPH sxs with nocturia. Had reassuring prostate MRI then biopsy. Continues finasteride. Requests yearly PSA for this reason.  Lung cancer screening - first CT was 2015, latest 12/2019. Aged out.  Flu shot yearly  COVID vaccine Pfizer 03/2019, 04/2019, no booster. Notes he lost taste after COVID shots.  Pneumovax - 2013. Prevnar-13 2016. Prevnar-20 - he will check with pharmacy Td 2007  Shingrix - discussed, declines. Never had chicken pox.  Advanced directives: has living will at home. HCPOA would be wife then oldest son. Copy in chart (2016), no HCPOA form. Packet provided again today - he will update  Seat belt use discussed  Sunscreen use discussed. No changing moles on skin. Using wide brimmed hat. Saw Richard Powell for AKs  Smoking 1 ppd x 50+ yrs - currently 3/4 ppd. Wife also smokes. Precontemplative.  Alcohol - occasionally  Dentist q6 mo - has partial dentures  Eye exam yearly  Bowel - no constipation  Bladder - no incontinence   Married, lives with wife  Children 3- out of home  [employer] - served in [employer]  Occupation: research scientist (physical sciences) at  [employer], retired June 2015 Activity: stays active in the yard, walking more, golfing 3x/wk Diet: some water, fruits/vegetables daily      Relevant past medical, surgical, family and social history reviewed and updated as indicated. Interim medical history since our last visit reviewed. Allergies and medications reviewed and updated. Outpatient Medications Prior to Visit  Medication Sig Dispense Refill   finasteride (PROSCAR) 5 MG tablet Take 5 mg by mouth at bedtime.      Multiple Vitamins-Minerals (MULTIVITAMIN PO) Take 1 tablet by mouth at bedtime.      VITAMIN D, CHOLECALCIFEROL, PO Take 2,000 Units by mouth daily.     atorvastatin  (LIPITOR) 20 MG tablet Take 1 tablet (20 mg total) by mouth daily. 90 tablet 4   Coenzyme Q10 (CO Q 10 PO) Take 200 mg by mouth daily.     No  facility-administered medications prior to visit.     Per HPI unless specifically indicated in ROS section below Review of Systems  Objective:  BP (!) 140/84 (Cuff Size: Normal)   Pulse 72   Temp 97.9 F (36.6 C) (Oral)   Ht 5' 10 (1.778 m)   Wt 164 lb 12.8 oz (74.8 kg)   SpO2 100%   BMI 23.65 kg/m   Wt Readings from Last 3 Encounters:  01/31/24 164 lb 12.8 oz (74.8 kg)  08/02/23 164 lb 9.6 oz (74.7 kg)  01/27/23 166 lb 6 oz (75.5 kg)      Physical Exam Vitals and nursing note reviewed.  Constitutional:      General: He is not in acute distress.    Appearance: Normal appearance. He is well-developed. He is not ill-appearing.  HENT:     Head: Normocephalic and atraumatic.     Right Ear: Hearing, tympanic membrane, ear canal and external ear normal.     Left Ear: Hearing, tympanic membrane, ear canal and external ear normal.     Mouth/Throat:     Mouth: Mucous membranes are moist.     Pharynx: Oropharynx is clear. No oropharyngeal exudate or posterior oropharyngeal erythema.  Eyes:     General: No scleral icterus.    Extraocular Movements: Extraocular movements intact.     Conjunctiva/sclera: Conjunctivae normal.     Pupils: Pupils are equal, round, and reactive to light.  Neck:     Thyroid : No thyroid  mass or thyromegaly.     Vascular: No carotid bruit.  Cardiovascular:     Rate and Rhythm: Normal rate and regular rhythm.     Pulses: Normal pulses.          Radial pulses are 2+ on the right side and 2+ on the left side.     Heart sounds: Normal heart sounds. No murmur heard. Pulmonary:     Effort: Pulmonary effort is normal. No respiratory distress.     Breath sounds: Normal breath sounds. No wheezing, rhonchi or rales.  Abdominal:     General: Bowel sounds are normal. There is no distension.     Palpations: Abdomen is soft. There is no mass.     Tenderness: There is no abdominal tenderness. There is no guarding or rebound.     Hernia: No hernia is present.   Musculoskeletal:        General: Normal range of motion.     Cervical back: Normal range of motion and neck supple.     Right lower leg: No edema.     Left lower leg: No edema.  Lymphadenopathy:     Cervical:  No cervical adenopathy.  Skin:    General: Skin is warm and dry.     Findings: No rash.  Neurological:     General: No focal deficit present.     Mental Status: He is alert and oriented to person, place, and time.  Psychiatric:        Mood and Affect: Mood normal.        Behavior: Behavior normal.        Thought Content: Thought content normal.        Judgment: Judgment normal.       Results for orders placed or performed in visit on 01/24/24  Comprehensive metabolic panel with GFR   Collection Time: 01/24/24  8:00 AM  Result Value Ref Range   Sodium 138 135 - 145 mEq/L   Potassium 4.3 3.5 - 5.1 mEq/L   Chloride 103 96 - 112 mEq/L   CO2 29 19 - 32 mEq/L   Glucose, Bld 89 70 - 99 mg/dL   BUN 18 6 - 23 mg/dL   Creatinine, Ser 8.96 0.40 - 1.50 mg/dL   Total Bilirubin 1.0 0.2 - 1.2 mg/dL   Alkaline Phosphatase 80 39 - 117 U/L   AST 25 0 - 37 U/L   ALT 19 0 - 53 U/L   Total Protein 6.9 6.0 - 8.3 g/dL   Albumin 4.3 3.5 - 5.2 g/dL   GFR 31.58 >39.99 mL/min   Calcium  9.8 8.4 - 10.5 mg/dL  Lipid panel   Collection Time: 01/24/24  8:00 AM  Result Value Ref Range   Cholesterol 124 0 - 200 mg/dL   Triglycerides 16.9 0.0 - 149.0 mg/dL   HDL 53.39 >60.99 mg/dL   VLDL 83.3 0.0 - 59.9 mg/dL   LDL Cholesterol 61 0 - 99 mg/dL   Total CHOL/HDL Ratio 3    NonHDL 77.74   PSA   Collection Time: 01/24/24  8:33 AM  Result Value Ref Range   PSA 1.74 0.10 - 4.00 ng/mL      01/31/2024   10:07 AM 01/27/2023   10:43 AM 07/07/2021    9:39 AM 07/03/2020    8:29 AM 04/22/2019    3:17 PM  Depression screen PHQ 2/9  Decreased Interest 0 0 0 0 0  Down, Depressed, Hopeless 0 0 0 0 0  PHQ - 2 Score 0 0 0 0 0  Altered sleeping 0    0  Tired, decreased energy 0    0  Change in appetite  0    0  Feeling bad or failure about yourself  0    0  Trouble concentrating 0    0  Moving slowly or fidgety/restless 0    0  Suicidal thoughts 0    0  PHQ-9 Score 0    0   Difficult doing work/chores Not difficult at all    Not difficult at all     Data saved with a previous flowsheet row definition    Assessment & Plan:   Problem List Items Addressed This Visit     Medicare annual wellness visit, subsequent - Primary (Chronic)   I have personally reviewed the Medicare Annual Wellness questionnaire and have noted 1. The patient's medical and social history 2. Their use of alcohol, tobacco or illicit drugs 3. Their current medications and supplements 4. The patient's functional ability including ADL's, fall risks, home safety risks and hearing or visual impairment. Cognitive function has been assessed and addressed as indicated.  5. Diet  and physical activity 6. Evidence for depression or mood disorders The patients weight, height, BMI have been recorded in the chart. I have made referrals, counseling and provided education to the patient based on review of the above and I have provided the pt with a written personalized care plan for preventive services. Provider list updated.. See scanned questionairre as needed for further documentation. Reviewed preventative protocols and updated unless pt declined.       Advanced care planning/counseling discussion (Chronic)   Previously discussed. Asked to bring updated HCPOA.       HLD (hyperlipidemia)   Chronic, well controlled on atorvastatin  - continue. The ASCVD Risk score (Arnett DK, et al., 2019) failed to calculate for the following reasons:   The 2019 ASCVD risk score is only valid for ages 22 to 41       Relevant Medications   atorvastatin  (LIPITOR) 20 MG tablet   Smoker   Encouraged full cessation Precontemplative.       Benign prostatic hyperplasia   Appreciate urology care on finasteride. Requests yearly PSA through  our office.       COPD (chronic obstructive pulmonary disease) (HCC)   Asymptomatic off respiratory medication. Encouraged full cessation      Thoracic aorta atherosclerosis   Continue statin.       Relevant Medications   atorvastatin  (LIPITOR) 20 MG tablet   Elevated blood pressure reading without diagnosis of hypertension   BP mildly elevated in office today.  He notes he likes salt.  Reviewed low salt/sodium diet. DASH diet handout provided. Rec buy home BP cuff to monitor readings at home and let us  know if consistently >140/90 to consider antihypertensive.  Likely <150/90 adequate given age.         Meds ordered this encounter  Medications   atorvastatin  (LIPITOR) 20 MG tablet    Sig: Take 1 tablet (20 mg total) by mouth daily.    Dispense:  90 tablet    Refill:  3    No orders of the defined types were placed in this encounter.   Patient Instructions  Get flu shot at local pharmacy. Ask for prevnar-20 at that time as well.  Work on quitting smoking Bring us  copy of health care power of attorney form.  Try over the counter antihistamine like claritin or allegra. Avoid decongestants.   Buy arm home BP cuff to monitor blood pressures. Your goal blood pressure is <140/90. Work on low salt/sodium diet - goal <2 grams (2,000mg ) per day. Eat a diet high in fruits/vegetables and whole grains.  Look into mediterranean and DASH diet. Goal activity is 177min/wk of moderate intensity exercise.  This can be split into 30 minute chunks.  If you are not at this level, you can start with smaller 10-15 min increments and slowly build up activity. Look at www.heart.org for more resources   Call [facility] (Richard Powell) to schedule an appointment to discuss repeat colonoscopy at [phone].   Good to see you today Return as needed or in 1 year for next wellness visit.   Follow up plan: Return in about 1 year (around 01/30/2025) for medicare wellness visit.  Anton Blas,  MD

## 2024-01-31 NOTE — Assessment & Plan Note (Addendum)
 BP mildly elevated in office today.  He notes he likes salt.  Reviewed low salt/sodium diet. DASH diet handout provided. Rec buy home BP cuff to monitor readings at home and let us  know if consistently >140/90 to consider antihypertensive.  Likely <150/90 adequate given age.

## 2024-01-31 NOTE — Assessment & Plan Note (Signed)
 Chronic, well controlled on atorvastatin  - continue. The ASCVD Risk score (Arnett DK, et al., 2019) failed to calculate for the following reasons:   The 2019 ASCVD risk score is only valid for ages 41 to 20

## 2024-01-31 NOTE — Assessment & Plan Note (Signed)
 Asymptomatic off respiratory medication. Encouraged full cessation

## 2024-01-31 NOTE — Assessment & Plan Note (Addendum)
Encouraged full cessation. Precontemplative.

## 2024-01-31 NOTE — Patient Instructions (Addendum)
 Get flu shot at local pharmacy. Ask for prevnar-20 at that time as well.  Work on quitting smoking Bring us  copy of health care power of attorney form.  Try over the counter antihistamine like claritin or allegra. Avoid decongestants.   Buy arm home BP cuff to monitor blood pressures. Your goal blood pressure is <140/90. Work on low salt/sodium diet - goal <2 grams (2,000mg ) per day. Eat a diet high in fruits/vegetables and whole grains.  Look into mediterranean and DASH diet. Goal activity is 166min/wk of moderate intensity exercise.  This can be split into 30 minute chunks.  If you are not at this level, you can start with smaller 10-15 min increments and slowly build up activity. Look at www.heart.org for more resources   Call [facility] (Pyrt[facility]) to schedule an appointment to discuss repeat colonoscopy at [phone].   Good to see you today Return as needed or in 1 year for next wellness visit.

## 2024-01-31 NOTE — Assessment & Plan Note (Signed)
 Previously discussed. Asked to bring updated HCPOA.

## 2024-01-31 NOTE — Assessment & Plan Note (Signed)
 Appreciate urology care on finasteride. Requests yearly PSA through our office.

## 2024-02-10 ENCOUNTER — Encounter: Payer: Self-pay | Admitting: Family Medicine

## 2024-02-11 ENCOUNTER — Encounter: Payer: Self-pay | Admitting: Family Medicine

## 2024-02-12 NOTE — Telephone Encounter (Signed)
 Patient sent multiple message I have sent one back asking that they call for appointment.

## 2024-02-14 ENCOUNTER — Encounter: Payer: Self-pay | Admitting: Primary Care

## 2024-02-14 ENCOUNTER — Ambulatory Visit: Admitting: Primary Care

## 2024-02-14 VITALS — BP 120/70 | HR 82 | Temp 98.0°F | Ht 70.0 in | Wt 165.1 lb

## 2024-02-14 DIAGNOSIS — R42 Dizziness and giddiness: Secondary | ICD-10-CM | POA: Diagnosis not present

## 2024-02-14 DIAGNOSIS — R030 Elevated blood-pressure reading, without diagnosis of hypertension: Secondary | ICD-10-CM | POA: Diagnosis not present

## 2024-02-14 MED ORDER — MECLIZINE HCL 25 MG PO TABS: 12.5000 mg | ORAL_TABLET | Freq: Three times a day (TID) | ORAL | 0 refills | Status: AC | PRN

## 2024-02-14 NOTE — Progress Notes (Signed)
 "  Subjective:    Patient ID: Richard Powell, male    DOB: 22-Jan-1946, 78 y.o.   MRN: [id number]  Richard Powell is a very pleasant 78 y.o. male patient of Dr. Rilla with a history of thoracic aortic atherosclerosis, OSA, COPD, hyperlipidemia, elevated blood pressure reading who presents today to discuss dizziness and elevated blood pressure readings.  Over the last several weeks he has noticed intermittent dizziness. His first episode occurred when rising from a seated position which was followed by nausea. Since then he's continued to notice intermittent dizziness with rapid motion of his body, head, or eyes. He has to hold onto objects as he feels as though the room is spinning.   He's also noticed a chronic intermittent right occipital lobe headache that occurs when bending forward below the level of his heart, blowing his nose, or coughing. The headache lasts for less than 5 seconds. This is not new.  He's checking his BP at home six times daily which is ranging 140-160 systolic. He's reduced his intake of salt, increasing fruit consumption. He has an arm cuff, uses the same arm.   Evaluated by PCP 2 weeks ago for annual exam, BP was elevated. Two weeks prior to this visit he was at the dentist's office and tested high with a wrist cuff. He does not recall the readings.   He's also been told by his wife that his hearing has declined. He denies a family history of hypertension. He is a smoker, plans on quitting soon.  He denies unilateral weakness, changes in speech, confusion.  BP Readings from Last 3 Encounters:  02/14/24 120/70  01/31/24 (!) 140/84  08/02/23 130/86      Review of Systems  Constitutional:  Negative for fatigue.  Respiratory:  Negative for shortness of breath.   Cardiovascular:  Negative for chest pain.  Neurological:  Positive for dizziness and headaches. Negative for speech difficulty and numbness.  Psychiatric/Behavioral:  Negative for confusion.           Past Medical History:  Diagnosis Date   BPH (benign prostatic hypertrophy)    finasteride ([place])   CAD (coronary artery disease) 05/2015   3v CAD by CT scan   Cataract    COPD (chronic obstructive pulmonary disease) (HCC)    by screening CT scan   ED (erectile dysfunction) of organic origin    Elevated PSA    benign prostate biopsy 02/2012 ([place])   Hyperlipemia    mild   Sleep apnea    has cpap; uses occasionally.  uses nose strips    Social History   Socioeconomic History   Marital status: Married    Spouse name: Not on file   Number of children: 3   Years of education: Not on file   Highest education level: Master's degree (e.g., MA, MS, MEng, MEd, MSW, MBA)  Occupational History   Occupation: Agricultural Consultant at [employer]    Comment: computers  Tobacco Use   Smoking status: Every Day    Current packs/day: 1.00    Average packs/day: 1 pack/day for 62.0 years (62.0 ttl pk-yrs)    Types: Cigarettes   Smokeless tobacco: Never  Vaping Use   Vaping status: Never Used  Substance and Sexual Activity   Alcohol use: Yes    Alcohol/week: 2.0 standard drinks of alcohol    Types: 2 Cans of beer per week    Comment: two beer a weeks with meal  Drug use: Never   Sexual activity: Not Currently    Comment: no  Other Topics Concern   Not on file  Social History Narrative   Married, lives with wife   Children 3- out of home   Military - served in [employer]    Occupation: research scientist (physical sciences) at [employer], retired June 2015   Activity: stays active at home, walking more, golfing   Diet: some water, fruits/vegetables daily   Social Drivers of Health   Tobacco Use: High Risk (02/14/2024)   Patient History    Smoking Tobacco Use: Every Day    Smokeless Tobacco Use: Never    Passive Exposure: Not on file  Financial Resource Strain: Low Risk (01/31/2024)   Overall Financial Resource Strain (CARDIA)    Difficulty of Paying Living Expenses: Not hard at  all  Food Insecurity: No Food Insecurity (01/31/2024)   Epic    Worried About Programme Researcher, Broadcasting/film/video in the Last Year: Never true    Ran Out of Food in the Last Year: Never true  Transportation Needs: No Transportation Needs (01/31/2024)   Epic    Lack of Transportation (Medical): No    Lack of Transportation (Non-Medical): No  Physical Activity: Sufficiently Active (01/31/2024)   Exercise Vital Sign    Days of Exercise per Week: 3 days    Minutes of Exercise per Session: 150+ min  Stress: No Stress Concern Present (01/31/2024)   [employer] of Occupational Health - Occupational Stress Questionnaire    Feeling of Stress: Not at all  Social Connections: Moderately Integrated (01/31/2024)   Social Connection and Isolation Panel    Frequency of Communication with Friends and Family: Three times a week    Frequency of Social Gatherings with Friends and Family: Three times a week    Attends Religious Services: Never    Active Member of Clubs or Organizations: Yes    Attends Banker Meetings: More than 4 times per year    Marital Status: Married  Catering Manager Violence: Not At Risk (07/07/2021)   Humiliation, Afraid, Rape, and Kick questionnaire    Fear of Current or Ex-Partner: No    Emotionally Abused: No    Physically Abused: No    Sexually Abused: No  Depression (PHQ2-9): Low Risk (02/14/2024)   Depression (PHQ2-9)    PHQ-2 Score: 0  Alcohol Screen: Low Risk (01/31/2024)   Alcohol Screen    Last Alcohol Screening Score (AUDIT): 3  Housing: Low Risk (01/31/2024)   Epic    Unable to Pay for Housing in the Last Year: No    Number of Times Moved in the Last Year: 0    Homeless in the Last Year: No  Utilities: Not on file  Health Literacy: Not on file    Past Surgical History:  Procedure Laterality Date   APPENDECTOMY  1973   CATARACT EXTRACTION W/PHACO Right 08/02/2016   Procedure: CATARACT EXTRACTION PHACO AND INTRAOCULAR LENS PLACEMENT (IOC);  Surgeon:  Jaye Fallow, MD;  Location: [facility];  Service: Ophthalmology;  Laterality: Right;  US  01:23 AP% 18.0 CDE 14.92 Fluid pack lot # [id number]   CATARACT EXTRACTION W/PHACO Left 08/16/2016   Procedure: CATARACT EXTRACTION PHACO AND INTRAOCULAR LENS PLACEMENT (IOC);  Surgeon: Jaye Fallow, MD;  Location: [facility];  Service: Ophthalmology;  Laterality: Left;  US  01:20 AP% 24.7 CDE 19.76 Fluid pack lot # [id number]   COLONOSCOPY  01/21/2010   severe diverticulosis, 1 hyperplastic polyp, rpt 5 yrs [place])   COLONOSCOPY  04/22/2015   hyperplastic polyps, severe diverticulosis, int hem, rpt 10 yrs ([place])   COLONOSCOPY  12/2020   TAx3, severe diverticulosis, fair colon prep - discuss 1 yr regarding rpt ([place])   EYE SURGERY  2019   cataract removal   HAND SURGERY Left 02/21/1958   VASECTOMY  02/22/1971    Family History  Problem Relation Age of Onset   Colon cancer Mother 49   Cancer Mother    Cancer Maternal Grandmother        ? abd cancer   CAD Neg Hx    Stroke Neg Hx    Diabetes Neg Hx    Esophageal cancer Neg Hx    Stomach cancer Neg Hx    Rectal cancer Neg Hx     Allergies[1]  Medications Ordered Prior to Encounter[2]  BP 120/70 (Patient Position: Standing)   Pulse 82   Temp 98 F (36.7 C) (Oral)   Ht 5' 10 (1.778 m)   Wt 165 lb 2 oz (74.9 kg)   SpO2 100%   BMI 23.69 kg/m  Objective:   Physical Exam Eyes:     Extraocular Movements: Extraocular movements intact.  Cardiovascular:     Rate and Rhythm: Normal rate and regular rhythm.  Pulmonary:     Effort: Pulmonary effort is normal.     Breath sounds: Normal breath sounds.  Musculoskeletal:     Cervical back: Neck supple.  Skin:    General: Skin is warm and dry.  Neurological:     Mental Status: He is alert and oriented to person, place, and time.     Cranial Nerves: No cranial nerve deficit.     Coordination: Coordination normal.  Psychiatric:        Mood and Affect: Mood normal.      Physical Exam        Assessment & Plan:  Elevated blood pressure reading without diagnosis of hypertension Assessment & Plan: Blood pressure readings in the office overall controlled.  Would avoid antihypertensive treatment at this point given the moderate drop in BP from lying to standing.  His dizziness could be multifactorial including orthostatic and vertigo. We discussed this.   Commended him on dietary changes. Encouraged smoking cessation.   Discussed to refrain from checking BP so frequently as it will fluctuate throughout the day.   Dizziness Assessment & Plan: Symptoms seem multifactorial including orthostatic dizziness and vertigo. Stroke seems less likely given HPI and exam today, although he is at higher risk.  Start meclizine  12.5 mg 3 times daily as needed.  Drowsiness precautions provided We discussed Epley's maneuvers, handout provided  We also discussed to rise from seated and lying positions slowly.  We discussed obtaining a CT head given his odd symptoms. He kindly declines at this point but agrees to proceed if symptoms do not improve with above interventions.   Labs reviewed from December 2025 from recent CPE with PCP.  Orders: -     Meclizine  HCl; Take 0.5-1 tablets (12.5-25 mg total) by mouth 3 (three) times daily as needed for dizziness.  Dispense: 15 tablet; Refill: 0    Assessment and Plan Assessment & Plan     I personally spent a total of 30 minutes in the care of the patient today including preparing to see the patient, getting/reviewing separately obtained history, performing a medically appropriate exam/evaluation, counseling and educating, placing orders, and documenting clinical information in the EHR.     Cherina Dhillon K Lyndol Vanderheiden, NP       [  1] No Known Allergies [2]  Current Outpatient Medications on File Prior to Visit  Medication Sig Dispense Refill   atorvastatin  (LIPITOR) 20 MG tablet Take 1 tablet (20 mg total) by  mouth daily. 90 tablet 3   finasteride (PROSCAR) 5 MG tablet Take 5 mg by mouth at bedtime.      Multiple Vitamins-Minerals (MULTIVITAMIN PO) Take 1 tablet by mouth at bedtime.      VITAMIN D, CHOLECALCIFEROL, PO Take 2,000 Units by mouth daily.     No current facility-administered medications on file prior to visit.   "

## 2024-02-14 NOTE — Assessment & Plan Note (Signed)
 Blood pressure readings in the office overall controlled.  Would avoid antihypertensive treatment at this point given the moderate drop in BP from lying to standing.  His dizziness could be multifactorial including orthostatic and vertigo. We discussed this.   Commended him on dietary changes. Encouraged smoking cessation.   Discussed to refrain from checking BP so frequently as it will fluctuate throughout the day.

## 2024-02-14 NOTE — Patient Instructions (Addendum)
 You may take the meclizine  as needed for vertigo type symptoms.  Take 1/2-1 full tablet by mouth 3 times daily as needed.  This may cause drowsiness.  Try the Epley's maneuvers as discussed.  Check your blood pressure twice daily, once in the morning and once in the evening.  Be sure to use the same arm and rest at least 30 minutes before checking.  Remember to rise slowly from a seated and lying position.  Please update us  if no improvement.   It was a pleasure to see you today!

## 2024-02-14 NOTE — Assessment & Plan Note (Addendum)
 Symptoms seem multifactorial including orthostatic dizziness and vertigo. Stroke seems less likely given HPI and exam today, although he is at higher risk.  Start meclizine  12.5 mg 3 times daily as needed.  Drowsiness precautions provided We discussed Epley's maneuvers, handout provided  We also discussed to rise from seated and lying positions slowly.  We discussed obtaining a CT head given his odd symptoms. He kindly declines at this point but agrees to proceed if symptoms do not improve with above interventions.   Labs reviewed from December 2025 from recent CPE with PCP.

## 2025-01-27 ENCOUNTER — Other Ambulatory Visit

## 2025-02-03 ENCOUNTER — Encounter: Admitting: Family Medicine
